# Patient Record
Sex: Female | Born: 1935 | Race: Black or African American | Hispanic: No | Marital: Single
Health system: Southern US, Community
[De-identification: ages and names within clinical notes are randomized; demographics above are authoritative.]

---

## 2006-03-09 ENCOUNTER — Ambulatory Visit: Payer: Self-pay | Admitting: Family Medicine

## 2006-03-10 ENCOUNTER — Ambulatory Visit: Payer: Self-pay | Admitting: Family Medicine

## 2007-03-23 ENCOUNTER — Ambulatory Visit: Payer: Self-pay | Admitting: Family Medicine

## 2008-10-10 ENCOUNTER — Ambulatory Visit: Payer: Self-pay | Admitting: Family Medicine

## 2009-12-12 ENCOUNTER — Ambulatory Visit (HOSPITAL_COMMUNITY)
Admission: RE | Admit: 2009-12-12 | Discharge: 2009-12-12 | Payer: Self-pay | Source: Home / Self Care | Admitting: Ophthalmology

## 2010-04-15 LAB — GLUCOSE, CAPILLARY
Glucose-Capillary: 104 mg/dL — ABNORMAL HIGH (ref 70–99)
Glucose-Capillary: 107 mg/dL — ABNORMAL HIGH (ref 70–99)
Glucose-Capillary: 154 mg/dL — ABNORMAL HIGH (ref 70–99)

## 2010-04-15 LAB — COMPREHENSIVE METABOLIC PANEL
AST: 21 U/L (ref 0–37)
Albumin: 3.5 g/dL (ref 3.5–5.2)
Calcium: 8.9 mg/dL (ref 8.4–10.5)
Chloride: 91 mEq/L — ABNORMAL LOW (ref 96–112)
Creatinine, Ser: 2.13 mg/dL — ABNORMAL HIGH (ref 0.4–1.2)
GFR calc Af Amer: 28 mL/min — ABNORMAL LOW (ref >60)
Sodium: 139 mEq/L (ref 135–145)

## 2010-04-15 LAB — CBC
MCH: 28.6 pg (ref 26.0–34.0)
MCHC: 31.7 g/dL (ref 30.0–36.0)
Platelets: 225 10*3/uL (ref 150–400)
RBC: 3.43 MIL/uL — ABNORMAL LOW (ref 3.87–5.11)

## 2010-05-05 NOTE — Op Note (Signed)
NAMENOORA, LOCASCIO                  ACCOUNT NO.:  [id number]  MEDICAL RECORD NO.:  [id number]          PATIENT TYPE:  AMB  LOCATION:  SDS                          FACILITY:  [facility]  PHYSICIAN:  Chalmers Guest, M.D.     DATE OF BIRTH:  1936/01/28  DATE OF PROCEDURE:  12/12/2009 DATE OF DISCHARGE:  12/12/2009                              OPERATIVE REPORT   PREOPERATIVE DIAGNOSIS:  Visually significant cataract left eye and controlled primary open-angle glaucoma.  The patient requires hospital surgery because of history of congestive heart failure, sleep apnea, uses home oxygen.  The patient has multiple medical problems.  PROCEDURE:  Phacoemulsification intraocular lens implant.  COMPLICATIONS:  None.  ANESTHESIA:  Xylocaine 2% with epinephrine and 50/50 mixture of 0.75% Marcaine with ampule of Wydase.  The patient was taken to the operative suite where she was given a peribulbar block with aforementioned local anesthetic agent.  The patient was extremely anxious and she was told by both anesthesia and the surgeon that we would do the best that we could to try to prevent intubating the patient because of the aforementioned lung and heart conditions.  After the patient's face was prepped and draped in usual sterile fashion, surgeon sitting temporally, a Weck-Cel sponge was used to fixate the globe.  A 15-degree blade was used to enter through the inferior clear cornea into the eye and Viscoat was injected.  Following this using a 2.75-mm keratome blade in a stepwise fashion through temporal clear cornea.  The eye was entered again.  A bent 25-gauge needle was used to incise the anterior capsule.  A curvilinear continuous tear capsulorrhexis was formed.  BSS was used to hydrodissect and hydrodelineate the nucleus.  Following this, the phacoemulsification unit was then used to sculpt the nucleus and the patient started to move a lot on the table.  Instruments were withdrawn and  preservative free 2% Xylocaine was injected into the anterior chamber and the patient was given a increased amount of oxygen, and slightly more sedation, procedure proceeded.  The patient's nucleus was sculpted and then the nucleus was cracked in half and the nuclear fragments were removed with no problem.  Following this, the IA was then used to remove the epinucleus and cortical fibers from the eye.  The posterior capsule remained intact.  Subincisional cortex was removed with angle cannula. Following this, Provisc was injected in the eye and it was noted that the capsule did appear to be a bit wrinkled at this point.  The intraocular lens implant which was the Alcon AcrySof and 60WF 24 diopter lens was placed in the [facility] spring injector, noted to have no defects. It was unfolded into the capsular bag.  The lens was centered with a Kuglen hook.  Following this, the Miostat was injected and viscoelastic was removed from the eye.  A single 10-0 nylon suture was placed, there being no leakage.  Topical TobraDex was placed on the eye.  A patch and Fox shield were placed and the patient returned to recovery area in stable condition.     Chalmers Guest, M.D.  RW/MEDQ  D:  12/12/2009  T:  12/13/2009  Job:  [id number]  Electronically Signed by Chalmers Guest M.D. on 05/05/2010 01:35:09 PM

## 2010-10-23 ENCOUNTER — Emergency Department: Payer: Self-pay | Admitting: Internal Medicine

## 2013-07-04 ENCOUNTER — Inpatient Hospital Stay: Payer: Self-pay | Admitting: Specialist

## 2013-07-04 LAB — CBC
HCT: 30.2 % — AB (ref 35.0–47.0)
HGB: 10 g/dL — ABNORMAL LOW (ref 12.0–16.0)
MCH: 29.9 pg (ref 26.0–34.0)
MCHC: 33.1 g/dL (ref 32.0–36.0)
MCV: 90 fL (ref 80–100)
PLATELETS: 237 10*3/uL (ref 150–440)
RBC: 3.34 10*6/uL — ABNORMAL LOW (ref 3.80–5.20)
RDW: 13.7 % (ref 11.5–14.5)
WBC: 11.7 10*3/uL — ABNORMAL HIGH (ref 3.6–11.0)

## 2013-07-04 LAB — URINALYSIS, COMPLETE
Bacteria: NONE SEEN
Bilirubin,UR: NEGATIVE
GLUCOSE, UR: NEGATIVE mg/dL (ref 0–75)
Granular Cast: 2
Hyaline Cast: 8
Ketone: NEGATIVE
NITRITE: NEGATIVE
PH: 5 (ref 4.5–8.0)
Protein: NEGATIVE
Specific Gravity: 1.011 (ref 1.003–1.030)
Squamous Epithelial: 2

## 2013-07-04 LAB — COMPREHENSIVE METABOLIC PANEL
ANION GAP: 12 (ref 7–16)
Albumin: 3.1 g/dL — ABNORMAL LOW (ref 3.4–5.0)
Alkaline Phosphatase: 66 U/L
BUN: 200 mg/dL — ABNORMAL HIGH (ref 7–18)
Bilirubin,Total: 0.5 mg/dL (ref 0.2–1.0)
CHLORIDE: 83 mmol/L — AB (ref 98–107)
CO2: 30 mmol/L (ref 21–32)
Calcium, Total: 9.7 mg/dL (ref 8.5–10.1)
Creatinine: 5.23 mg/dL — ABNORMAL HIGH (ref 0.60–1.30)
EGFR (Non-African Amer.): 7 — ABNORMAL LOW
GFR CALC AF AMER: 9 — AB
GLUCOSE: 198 mg/dL — AB (ref 65–99)
OSMOLALITY: 324 (ref 275–301)
POTASSIUM: 4.1 mmol/L (ref 3.5–5.1)
SGOT(AST): 30 U/L (ref 15–37)
SGPT (ALT): 12 U/L (ref 12–78)
Sodium: 125 mmol/L — ABNORMAL LOW (ref 136–145)
Total Protein: 8.9 g/dL — ABNORMAL HIGH (ref 6.4–8.2)

## 2013-07-04 LAB — PROTIME-INR
INR: 1.1
PROTHROMBIN TIME: 14.4 s (ref 11.5–14.7)

## 2013-07-04 LAB — TROPONIN I
TROPONIN-I: 0.14 ng/mL — AB
Troponin-I: 0.14 ng/mL — ABNORMAL HIGH
Troponin-I: 0.14 ng/mL — ABNORMAL HIGH

## 2013-07-05 LAB — BASIC METABOLIC PANEL
ANION GAP: 12 (ref 7–16)
BUN: 172 mg/dL — ABNORMAL HIGH (ref 7–18)
Calcium, Total: 9.5 mg/dL (ref 8.5–10.1)
Chloride: 90 mmol/L — ABNORMAL LOW (ref 98–107)
Co2: 28 mmol/L (ref 21–32)
Creatinine: 4.06 mg/dL — ABNORMAL HIGH (ref 0.60–1.30)
EGFR (African American): 12 — ABNORMAL LOW
GFR CALC NON AF AMER: 10 — AB
Glucose: 144 mg/dL — ABNORMAL HIGH (ref 65–99)
Osmolality: 320 (ref 275–301)
Potassium: 3.5 mmol/L (ref 3.5–5.1)
Sodium: 130 mmol/L — ABNORMAL LOW (ref 136–145)

## 2013-07-05 LAB — CBC WITH DIFFERENTIAL/PLATELET
BASOS PCT: 1.4 %
Basophil #: 0.1 10*3/uL (ref 0.0–0.1)
EOS ABS: 0.2 10*3/uL (ref 0.0–0.7)
Eosinophil %: 2.1 %
HCT: 28 % — AB (ref 35.0–47.0)
HGB: 9.5 g/dL — ABNORMAL LOW (ref 12.0–16.0)
Lymphocyte #: 1.3 10*3/uL (ref 1.0–3.6)
Lymphocyte %: 15.2 %
MCH: 30.6 pg (ref 26.0–34.0)
MCHC: 33.7 g/dL (ref 32.0–36.0)
MCV: 91 fL (ref 80–100)
MONO ABS: 1.1 x10 3/mm — AB (ref 0.2–0.9)
Monocyte %: 12.3 %
NEUTROS ABS: 5.9 10*3/uL (ref 1.4–6.5)
Neutrophil %: 69 %
Platelet: 202 10*3/uL (ref 150–440)
RBC: 3.09 10*6/uL — ABNORMAL LOW (ref 3.80–5.20)
RDW: 13.8 % (ref 11.5–14.5)
WBC: 8.6 10*3/uL (ref 3.6–11.0)

## 2013-07-05 LAB — PROTEIN ELECTROPHORESIS(ARMC)

## 2013-07-06 LAB — RENAL FUNCTION PANEL
ANION GAP: 11 (ref 7–16)
Albumin: 2.7 g/dL — ABNORMAL LOW (ref 3.4–5.0)
BUN: 145 mg/dL — AB (ref 7–18)
CREATININE: 3.19 mg/dL — AB (ref 0.60–1.30)
Calcium, Total: 9.7 mg/dL (ref 8.5–10.1)
Chloride: 96 mmol/L — ABNORMAL LOW (ref 98–107)
Co2: 27 mmol/L (ref 21–32)
EGFR (African American): 16 — ABNORMAL LOW
GFR CALC NON AF AMER: 14 — AB
GLUCOSE: 176 mg/dL — AB (ref 65–99)
OSMOLALITY: 320 (ref 275–301)
POTASSIUM: 3.3 mmol/L — AB (ref 3.5–5.1)
Phosphorus: 4.8 mg/dL (ref 2.5–4.9)
SODIUM: 134 mmol/L — AB (ref 136–145)

## 2013-07-06 LAB — UR PROT ELECTROPHORESIS, URINE RANDOM

## 2013-12-16 ENCOUNTER — Ambulatory Visit: Payer: Self-pay

## 2013-12-16 LAB — CBC WITH DIFFERENTIAL/PLATELET
BASOS ABS: 0.1 10*3/uL (ref 0.0–0.1)
Basophil %: 1.3 %
EOS ABS: 0.1 10*3/uL (ref 0.0–0.7)
EOS PCT: 1.4 %
HCT: 22.9 % — AB (ref 35.0–47.0)
HGB: 7.3 g/dL — AB (ref 12.0–16.0)
Lymphocyte #: 1.6 10*3/uL (ref 1.0–3.6)
Lymphocyte %: 18.3 %
MCH: 25.9 pg — ABNORMAL LOW (ref 26.0–34.0)
MCHC: 31.7 g/dL — ABNORMAL LOW (ref 32.0–36.0)
MCV: 82 fL (ref 80–100)
Monocyte #: 1.1 x10 3/mm — ABNORMAL HIGH (ref 0.2–0.9)
Monocyte %: 12.4 %
NEUTROS ABS: 5.8 10*3/uL (ref 1.4–6.5)
Neutrophil %: 66.6 %
Platelet: 234 10*3/uL (ref 150–440)
RBC: 2.81 10*6/uL — ABNORMAL LOW (ref 3.80–5.20)
RDW: 16.3 % — AB (ref 11.5–14.5)
WBC: 8.7 10*3/uL (ref 3.6–11.0)

## 2013-12-16 LAB — COMPREHENSIVE METABOLIC PANEL
ALBUMIN: 2.1 g/dL — AB (ref 3.4–5.0)
ALT: 6 U/L — AB
AST: 14 U/L — AB (ref 15–37)
Alkaline Phosphatase: 52 U/L
Anion Gap: 10 (ref 7–16)
BILIRUBIN TOTAL: 0.2 mg/dL (ref 0.2–1.0)
BUN: 112 mg/dL — ABNORMAL HIGH (ref 7–18)
CALCIUM: 6.8 mg/dL — AB (ref 8.5–10.1)
CO2: 31 mmol/L (ref 21–32)
CREATININE: 2.76 mg/dL — AB (ref 0.60–1.30)
Chloride: 96 mmol/L — ABNORMAL LOW (ref 98–107)
EGFR (African American): 22 — ABNORMAL LOW
EGFR (Non-African Amer.): 18 — ABNORMAL LOW
GLUCOSE: 128 mg/dL — AB (ref 65–99)
OSMOLALITY: 311 (ref 275–301)
POTASSIUM: 3.3 mmol/L — AB (ref 3.5–5.1)
Sodium: 137 mmol/L (ref 136–145)
TOTAL PROTEIN: 6.7 g/dL (ref 6.4–8.2)

## 2013-12-16 LAB — URINALYSIS, COMPLETE
Bilirubin,UR: NEGATIVE
Glucose,UR: NEGATIVE mg/dL (ref 0–75)
KETONE: NEGATIVE
NITRITE: NEGATIVE
PH: 5 (ref 4.5–8.0)
PROTEIN: NEGATIVE
RBC,UR: 1 /HPF (ref 0–5)
SPECIFIC GRAVITY: 1.01 (ref 1.003–1.030)

## 2013-12-19 ENCOUNTER — Inpatient Hospital Stay: Payer: Self-pay | Admitting: Internal Medicine

## 2013-12-19 LAB — URINALYSIS, COMPLETE
BILIRUBIN, UR: NEGATIVE
Glucose,UR: NEGATIVE mg/dL (ref 0–75)
KETONE: NEGATIVE
Nitrite: NEGATIVE
PROTEIN: NEGATIVE
Ph: 5 (ref 4.5–8.0)
RBC,UR: 10 /HPF (ref 0–5)
Specific Gravity: 1.013 (ref 1.003–1.030)

## 2013-12-19 LAB — COMPREHENSIVE METABOLIC PANEL
ALK PHOS: 53 U/L
AST: 13 U/L — AB (ref 15–37)
Albumin: 2.4 g/dL — ABNORMAL LOW (ref 3.4–5.0)
Anion Gap: 7 (ref 7–16)
BILIRUBIN TOTAL: 0.3 mg/dL (ref 0.2–1.0)
BUN: 133 mg/dL — AB (ref 7–18)
CALCIUM: 8.6 mg/dL (ref 8.5–10.1)
CHLORIDE: 92 mmol/L — AB (ref 98–107)
CO2: 34 mmol/L — AB (ref 21–32)
Creatinine: 3.13 mg/dL — ABNORMAL HIGH (ref 0.60–1.30)
EGFR (African American): 19 — ABNORMAL LOW
EGFR (Non-African Amer.): 15 — ABNORMAL LOW
GLUCOSE: 118 mg/dL — AB (ref 65–99)
OSMOLALITY: 310 (ref 275–301)
POTASSIUM: 3.8 mmol/L (ref 3.5–5.1)
SGPT (ALT): 8 U/L — ABNORMAL LOW
Sodium: 133 mmol/L — ABNORMAL LOW (ref 136–145)
TOTAL PROTEIN: 7.4 g/dL (ref 6.4–8.2)

## 2013-12-19 LAB — PRO B NATRIURETIC PEPTIDE: B-TYPE NATIURETIC PEPTID: 62845 pg/mL — AB (ref 0–450)

## 2013-12-19 LAB — URINE CULTURE

## 2013-12-19 LAB — IRON AND TIBC
IRON SATURATION: 5 %
Iron Bind.Cap.(Total): 266 ug/dL (ref 250–450)
Iron: 12 ug/dL — ABNORMAL LOW (ref 50–170)
UNBOUND IRON-BIND. CAP.: 254 ug/dL

## 2013-12-19 LAB — TSH: Thyroid Stimulating Horm: 3.94 u[IU]/mL

## 2013-12-19 LAB — CBC
HCT: 22.6 % — ABNORMAL LOW
HGB: 7.1 g/dL — ABNORMAL LOW
MCH: 25.7 pg — ABNORMAL LOW
MCHC: 31.4 g/dL — ABNORMAL LOW
MCV: 82 fL
Platelet: 215 10*3/uL
RBC: 2.76 X10 6/mm 3 — ABNORMAL LOW
RDW: 16.7 % — ABNORMAL HIGH
WBC: 8.1 10*3/uL

## 2013-12-19 LAB — FERRITIN: Ferritin (ARMC): 109 ng/mL

## 2013-12-19 LAB — PROTIME-INR
INR: 1.9
Prothrombin Time: 21.4 s — ABNORMAL HIGH

## 2013-12-19 LAB — TROPONIN I: Troponin-I: 0.11 ng/mL — ABNORMAL HIGH

## 2013-12-20 LAB — BASIC METABOLIC PANEL
ANION GAP: 8 (ref 7–16)
BUN: 135 mg/dL — AB (ref 7–18)
CALCIUM: 9.1 mg/dL (ref 8.5–10.1)
CHLORIDE: 91 mmol/L — AB (ref 98–107)
CREATININE: 2.98 mg/dL — AB (ref 0.60–1.30)
Co2: 34 mmol/L — ABNORMAL HIGH (ref 21–32)
Glucose: 101 mg/dL — ABNORMAL HIGH (ref 65–99)
Osmolality: 310 (ref 275–301)
Potassium: 3.5 mmol/L (ref 3.5–5.1)
Sodium: 133 mmol/L — ABNORMAL LOW (ref 136–145)

## 2013-12-20 LAB — CBC WITH DIFFERENTIAL/PLATELET
BASOS ABS: 0.1 10*3/uL (ref 0.0–0.1)
BASOS PCT: 0.7 %
EOS ABS: 0.2 10*3/uL (ref 0.0–0.7)
EOS PCT: 2.8 %
HCT: 22.1 % — ABNORMAL LOW (ref 35.0–47.0)
HGB: 6.9 g/dL — ABNORMAL LOW (ref 12.0–16.0)
LYMPHS ABS: 1.2 10*3/uL (ref 1.0–3.6)
LYMPHS PCT: 15.5 %
MCH: 25.8 pg — AB (ref 26.0–34.0)
MCHC: 31.3 g/dL — AB (ref 32.0–36.0)
MCV: 82 fL (ref 80–100)
MONO ABS: 1 x10 3/mm — AB (ref 0.2–0.9)
Monocyte %: 13.6 %
NEUTROS ABS: 5.1 10*3/uL (ref 1.4–6.5)
Neutrophil %: 67.4 %
Platelet: 209 10*3/uL (ref 150–440)
RBC: 2.69 10*6/uL — ABNORMAL LOW (ref 3.80–5.20)
RDW: 16.3 % — ABNORMAL HIGH (ref 11.5–14.5)
WBC: 7.6 10*3/uL (ref 3.6–11.0)

## 2013-12-20 LAB — PROTIME-INR
INR: 2
PROTHROMBIN TIME: 22.5 s — AB (ref 11.5–14.7)

## 2013-12-21 LAB — CBC WITH DIFFERENTIAL/PLATELET
Basophil #: 0.1 10*3/uL (ref 0.0–0.1)
Basophil %: 0.7 %
EOS PCT: 4.4 %
Eosinophil #: 0.3 10*3/uL (ref 0.0–0.7)
HCT: 24.8 % — AB (ref 35.0–47.0)
HGB: 7.9 g/dL — ABNORMAL LOW (ref 12.0–16.0)
LYMPHS ABS: 1.3 10*3/uL (ref 1.0–3.6)
Lymphocyte %: 17 %
MCH: 26.2 pg (ref 26.0–34.0)
MCHC: 31.7 g/dL — ABNORMAL LOW (ref 32.0–36.0)
MCV: 83 fL (ref 80–100)
Monocyte #: 1.1 x10 3/mm — ABNORMAL HIGH (ref 0.2–0.9)
Monocyte %: 13.9 %
NEUTROS PCT: 64 %
Neutrophil #: 4.8 10*3/uL (ref 1.4–6.5)
Platelet: 206 10*3/uL (ref 150–440)
RBC: 3 10*6/uL — ABNORMAL LOW (ref 3.80–5.20)
RDW: 16.2 % — AB (ref 11.5–14.5)
WBC: 7.5 10*3/uL (ref 3.6–11.0)

## 2013-12-21 LAB — URINE CULTURE

## 2013-12-21 LAB — PROTIME-INR
INR: 2.5
Prothrombin Time: 26.6 secs — ABNORMAL HIGH (ref 11.5–14.7)

## 2013-12-22 LAB — CBC WITH DIFFERENTIAL/PLATELET
BASOS ABS: 0 10*3/uL (ref 0.0–0.1)
Basophil %: 0.5 %
Eosinophil #: 0.2 10*3/uL (ref 0.0–0.7)
Eosinophil %: 3.7 %
HCT: 25.5 % — ABNORMAL LOW (ref 35.0–47.0)
HGB: 8.1 g/dL — AB (ref 12.0–16.0)
LYMPHS PCT: 21.2 %
Lymphocyte #: 1.4 10*3/uL (ref 1.0–3.6)
MCH: 26.1 pg (ref 26.0–34.0)
MCHC: 31.6 g/dL — ABNORMAL LOW (ref 32.0–36.0)
MCV: 83 fL (ref 80–100)
Monocyte #: 0.9 x10 3/mm (ref 0.2–0.9)
Monocyte %: 13.8 %
NEUTROS ABS: 4 10*3/uL (ref 1.4–6.5)
NEUTROS PCT: 60.8 %
Platelet: 204 10*3/uL (ref 150–440)
RBC: 3.09 10*6/uL — AB (ref 3.80–5.20)
RDW: 16.3 % — ABNORMAL HIGH (ref 11.5–14.5)
WBC: 6.6 10*3/uL (ref 3.6–11.0)

## 2013-12-22 LAB — BASIC METABOLIC PANEL
ANION GAP: 8 (ref 7–16)
BUN: 135 mg/dL — ABNORMAL HIGH (ref 7–18)
CALCIUM: 8.4 mg/dL — AB (ref 8.5–10.1)
CHLORIDE: 91 mmol/L — AB (ref 98–107)
CO2: 36 mmol/L — AB (ref 21–32)
Creatinine: 2.91 mg/dL — ABNORMAL HIGH (ref 0.60–1.30)
GFR CALC AF AMER: 20 — AB
GFR CALC NON AF AMER: 17 — AB
Glucose: 114 mg/dL — ABNORMAL HIGH (ref 65–99)
OSMOLALITY: 315 (ref 275–301)
POTASSIUM: 3.6 mmol/L (ref 3.5–5.1)
Sodium: 135 mmol/L — ABNORMAL LOW (ref 136–145)

## 2013-12-22 LAB — PROTIME-INR
INR: 2.4
PROTHROMBIN TIME: 25.9 s — AB (ref 11.5–14.7)

## 2013-12-23 LAB — CULTURE, BLOOD (SINGLE)

## 2013-12-24 LAB — CULTURE, BLOOD (SINGLE)

## 2014-01-01 ENCOUNTER — Inpatient Hospital Stay: Payer: Self-pay | Admitting: Internal Medicine

## 2014-01-01 LAB — URINALYSIS, COMPLETE
Bacteria: NONE SEEN
Bilirubin,UR: NEGATIVE
Blood: NEGATIVE
Glucose,UR: NEGATIVE mg/dL (ref 0–75)
KETONE: NEGATIVE
Nitrite: NEGATIVE
Ph: 5 (ref 4.5–8.0)
Protein: NEGATIVE
RBC,UR: 1 /HPF (ref 0–5)
Specific Gravity: 1.015 (ref 1.003–1.030)
Squamous Epithelial: 9

## 2014-01-01 LAB — COMPREHENSIVE METABOLIC PANEL
ALBUMIN: 2.3 g/dL — AB (ref 3.4–5.0)
ANION GAP: 10 (ref 7–16)
AST: 20 U/L (ref 15–37)
Alkaline Phosphatase: 50 U/L
BILIRUBIN TOTAL: 0.4 mg/dL (ref 0.2–1.0)
BUN: 131 mg/dL — AB (ref 7–18)
CHLORIDE: 99 mmol/L (ref 98–107)
Calcium, Total: 8.6 mg/dL (ref 8.5–10.1)
Co2: 34 mmol/L — ABNORMAL HIGH (ref 21–32)
Creatinine: 3.29 mg/dL — ABNORMAL HIGH (ref 0.60–1.30)
EGFR (African American): 18 — ABNORMAL LOW
GFR CALC NON AF AMER: 14 — AB
Glucose: 62 mg/dL — ABNORMAL LOW (ref 65–99)
OSMOLALITY: 325 (ref 275–301)
Potassium: 4.6 mmol/L (ref 3.5–5.1)
SGPT (ALT): <6 U/L — ABNORMAL LOW
SODIUM: 143 mmol/L (ref 136–145)
Total Protein: 7.7 g/dL (ref 6.4–8.2)

## 2014-01-01 LAB — CBC
HCT: 27 % — ABNORMAL LOW (ref 35.0–47.0)
HGB: 8.3 g/dL — ABNORMAL LOW (ref 12.0–16.0)
MCH: 25.9 pg — ABNORMAL LOW (ref 26.0–34.0)
MCHC: 30.6 g/dL — ABNORMAL LOW (ref 32.0–36.0)
MCV: 85 fL (ref 80–100)
Platelet: 191 10*3/uL (ref 150–440)
RBC: 3.19 10*6/uL — ABNORMAL LOW (ref 3.80–5.20)
RDW: 17.8 % — ABNORMAL HIGH (ref 11.5–14.5)
WBC: 10.1 10*3/uL (ref 3.6–11.0)

## 2014-01-01 LAB — PROTIME-INR
INR: 1.6
PROTHROMBIN TIME: 18.4 s — AB (ref 11.5–14.7)

## 2014-01-01 LAB — PHOSPHORUS: PHOSPHORUS: 6.9 mg/dL — AB (ref 2.5–4.9)

## 2014-01-01 LAB — AMMONIA: Ammonia, Plasma: 30 mcmol/L (ref 11–32)

## 2014-01-01 LAB — TROPONIN I: TROPONIN-I: 0.17 ng/mL — AB

## 2014-01-02 ENCOUNTER — Ambulatory Visit: Payer: Self-pay | Admitting: Internal Medicine

## 2014-01-02 LAB — CBC WITH DIFFERENTIAL/PLATELET
BASOS PCT: 1 %
Basophil #: 0.1 10*3/uL (ref 0.0–0.1)
Eosinophil #: 0 10*3/uL (ref 0.0–0.7)
Eosinophil %: 0.4 %
HCT: 26.9 % — AB (ref 35.0–47.0)
HGB: 8.2 g/dL — ABNORMAL LOW (ref 12.0–16.0)
LYMPHS ABS: 1.6 10*3/uL (ref 1.0–3.6)
Lymphocyte %: 16.3 %
MCH: 26 pg (ref 26.0–34.0)
MCHC: 30.6 g/dL — AB (ref 32.0–36.0)
MCV: 85 fL (ref 80–100)
MONO ABS: 1.3 x10 3/mm — AB (ref 0.2–0.9)
Monocyte %: 12.9 %
NEUTROS ABS: 7 10*3/uL — AB (ref 1.4–6.5)
Neutrophil %: 69.4 %
PLATELETS: 195 10*3/uL (ref 150–440)
RBC: 3.16 10*6/uL — ABNORMAL LOW (ref 3.80–5.20)
RDW: 18.1 % — AB (ref 11.5–14.5)
WBC: 10.1 10*3/uL (ref 3.6–11.0)

## 2014-01-02 LAB — BASIC METABOLIC PANEL
Anion Gap: 9 (ref 7–16)
BUN: 124 mg/dL — ABNORMAL HIGH (ref 7–18)
CREATININE: 3.21 mg/dL — AB (ref 0.60–1.30)
Calcium, Total: 8.8 mg/dL (ref 8.5–10.1)
Chloride: 100 mmol/L (ref 98–107)
Co2: 35 mmol/L — ABNORMAL HIGH (ref 21–32)
EGFR (African American): 18 — ABNORMAL LOW
EGFR (Non-African Amer.): 15 — ABNORMAL LOW
GLUCOSE: 72 mg/dL (ref 65–99)
OSMOLALITY: 325 (ref 275–301)
Potassium: 4.6 mmol/L (ref 3.5–5.1)
SODIUM: 144 mmol/L (ref 136–145)

## 2014-01-02 LAB — URINE CULTURE

## 2014-01-03 ENCOUNTER — Inpatient Hospital Stay: Payer: Self-pay | Admitting: Internal Medicine

## 2014-01-03 LAB — UR PROT ELECTROPHORESIS, URINE RANDOM

## 2014-01-03 LAB — PROTEIN ELECTROPHORESIS(ARMC)

## 2014-02-02 ENCOUNTER — Ambulatory Visit: Payer: Self-pay | Admitting: Internal Medicine

## 2014-02-02 DEATH — deceased

## 2014-05-26 NOTE — Consult Note (Signed)
Chief Complaint:  Subjective/Chief Complaint No bleeding. In fact, no BM. Stomach still bothers her. According to daughter, patient had bowel obstruction earlier this year and required surgery.   VITAL SIGNS/ANCILLARY NOTES: **Vital Signs.:   20-Nov-15 09:20  Vital Signs Type Q 4hr  Temperature Temperature (F) 98.6  Celsius 37  Temperature Source oral  Pulse Pulse 91  Respirations Respirations 18  Systolic BP Systolic BP 500  Diastolic BP (mmHg) Diastolic BP (mmHg) 68  Mean BP 81  Pulse Ox % Pulse Ox % 96  Pulse Ox Activity Level  At rest  Oxygen Delivery 2L   Brief Assessment:  GEN no acute distress   Cardiac Regular   Respiratory clear BS   Gastrointestinal soft, nontender, NABS   Lab Results: Routine Chem:  20-Nov-15 04:24   Glucose, Serum  114  BUN  135  Creatinine (comp)  2.91  Sodium, Serum  135  Potassium, Serum 3.6  Chloride, Serum  91  CO2, Serum  36  Calcium (Total), Serum  8.4  Anion Gap 8  Osmolality (calc) 315  eGFR (African American)  20  eGFR (Non-African American)  17 (eGFR values <26m/min/1.73 m2 may be an indication of chronic kidney disease (CKD). Calculated eGFR, using the MRDR Study equation, is useful in  patients with stable renal function. The eGFR calculation will not be reliable in acutely ill patients when serum creatinine is changing rapidly. It is not useful in patients on dialysis. The eGFR calculation may not be applicable to patients at the low and high extremes of body sizes, pregnant women, and vegetarians.)  Routine Coag:  20-Nov-15 04:24   Prothrombin  25.9  INR 2.4 (INR reference interval applies to patients on anticoagulant therapy. A single INR therapeutic range for coumarins is not optimal for all indications; however, the suggested range for most indications is 2.0 - 3.0. Exceptions to the INR Reference Range may include: Prosthetic heart valves, acute myocardial infarction, prevention of myocardial infarction,  and combinations of aspirin and anticoagulant. The need for a higher or lower target INR must be assessed individually. Reference: The Pharmacology and Management of the Vitamin K  antagonists: the seventh ACCP Conference on Antithrombotic and Thrombolytic Therapy. CBBCWU.8891Sept:126 (3suppl): [id number] A HCT value >55% may artifactually increase the PT.  In one study,  the increase was an average of 25%. Reference:  "Effect on Routine and Special Coagulation Testing Values of Citrate Anticoagulant Adjustment in Patients with High HCT Values." American Journal of Clinical Pathology 2006;126:400-405.)  Routine Hem:  20-Nov-15 04:24   WBC (CBC) 6.6  RBC (CBC)  3.09  Hemoglobin (CBC)  8.1  Hematocrit (CBC)  25.5  Platelet Count (CBC) 204  MCV 83  MCH 26.1  MCHC  31.6  RDW  16.3  Neutrophil % 60.8  Lymphocyte % 21.2  Monocyte % 13.8  Eosinophil % 3.7  Basophil % 0.5  Neutrophil # 4.0  Lymphocyte # 1.4  Monocyte # 0.9  Eosinophil # 0.2  Basophil # 0.0 (Result(s) reported on 22 Dec 2013 at 05:32AM.)   Assessment/Plan:  Assessment/Plan:  Assessment Anemia. Stable. No signs of bleeeding.   Plan Get records from [facility] Continue PPI. If stomach still bothers her, order 2 way abdominal series. Call GI on call if there are issues over the weekend. Thanks.   Electronic Signatures: OVerdie Shire(MD)  (Signed [phone])  Authored: Chief Complaint, VITAL SIGNS/ANCILLARY NOTES, Brief Assessment, Lab Results, Assessment/Plan   Last Updated: 20-Nov-15 10:18 by OVerdie Shire(MD)

## 2014-05-26 NOTE — H&P (Signed)
PATIENT NAME:  Evelyn [name], Evelyn [name] MR#:  [id number] DATE OF BIRTH:  [birth year]  DATE OF ADMISSION:  07/04/2013  PRIMARY CARE PHYSICIAN: Located at [facility].   CHIEF COMPLAINT: Weakness, difficulty ambulating and myoclonic jerky movements.   HISTORY OF PRESENT ILLNESS: This is a 79 year old female brought into the Emergency Room by her son due to increasing jerky movements and having difficulty ambulating. The patient normally is able to get around with the help of a walker, but today, she was not even able to get out of bed. The son also noticed that she has been having more and more increasingly jerky movements. As per the son, this has happened to her before when she was really dehydrated. The patient is on high-dose diuretics, but has not been eating and drinking well for the past few days. She was brought to the Emergency Room and was noted to be in acute on chronic renal failure with a creatinine as high as 5, with a BUN of 200. Hospitalist services were contacted for further treatment and evaluation. The patient denies any chest pain, any shortness of breath. She denies any nausea, vomiting, abdominal pain, fevers, chills, cough or any other associated symptoms presently.   REVIEW OF SYSTEMS:  CONSTITUTIONAL: No documented fever. Positive fatigue and weakness. No weight gain, no weight loss.  EYES: No blurry or double vision.  ENT: No tinnitus. No postnasal drip. No redness of the oropharynx.  RESPIRATORY: No cough. No wheeze. No hemoptysis. No dyspnea.  CARDIOVASCULAR: No chest pain. No orthopnea. No palpitations. No syncope.  GASTROINTESTINAL: No nausea, no vomiting, no diarrhea, no abdominal pain. No melena or hematochezia.  GENITOURINARY: No dysuria, no hematuria.  ENDOCRINE: No polyuria or nocturia. No heat or cold intolerance.  HEMATOLOGIC: No anemia, no bruising, no bleeding.  INTEGUMENTARY: No rashes. No lesions.  MUSCULOSKELETAL: No arthritis. No swelling. No gout.  NEUROLOGIC: No numbness or  tingling. No ataxia. No seizure-type activity.  PSYCHIATRIC: No anxiety. No insomnia. No ADD.   PAST MEDICAL HISTORY: Consistent with:  1. History of pulmonary hypertension.  2. Diabetes.  3. Chronic kidney disease stage IV.  4. Gout.  5. Glaucoma.  6. History of CHF. 7. Chronic pain.  8. COPD. 9. History of hyponatremia.  10. Peripheral vascular disease.   ALLERGIES: CODEINE AND REGLAN.   SOCIAL HISTORY: Used to be a smoker, quit about 30+ years ago. Does have a 30-pack-year smoking history. No alcohol abuse. No illicit drug abuse. Lives at home with her son.   FAMILY HISTORY: The patient's mother and father are both deceased. Mother died from complications of heart disease. She cannot recall what her father died from.   CURRENT MEDICATIONS: As follows:  1. Tylenol with oxycodone 10/325 two tabs 4 times daily as needed. 2. DuoNebs q.6 hours as needed. 3. Ammonium lactate topical cream applied daily.  4. Aspirin 81 mg daily.  5. Atorvastatin 40 mg at bedtime. 6. Azelastine 1 spray b.i.d. 7. Bimatoprost ophthalmic solution 0.03% 1 drop to left eye at bedtime.  8. Brimonidine ophthalmic solution 0.1% 1 drop to each eye b.i.d. 9. Cetirizine 10 mg daily. 10. Plavix 75 mg daily. 11. Flonase 2 sprays to each nostril daily. 12. Advair 250/50 one puff b.i.d. 13. Lasix 40 mg 4 tablets b.i.d. 14. Insulin 70/30 at 4 units b.i.d. 15. Latanoprost 0.005% ophthalmic solution at bedtime. 16. Metolazone 10 mg tablet 1 tab b.i.d. 17. Miconazole topical powder b.i.d. as needed.  18. Multivitamin daily. 19. Sublingual nitroglycerin as needed. 20. MiraLax  daily as needed. 21. Lyrica 25 mg b.i.d. 22. Promethazine 25 mg q.6 hours as needed.  23. Sildenafil 20 mg t.i.d. 24. Silver sulfadiazine topical cream to be applied to the affected area as needed.  25. Spiriva 1 puff daily. 26. Aldactone 100 mg daily.  27. Triamcinolone cream to be applied t.i.d.  28. Vibra-Tabs 100 mg 2 tabs for 10  days.   PHYSICAL EXAMINATION: Presently is as follows:  VITAL SIGNS: Noted to be: Temperature 98.5, pulse 76, respirations 20, blood pressure 107/59, saturation is 97% on room air.  GENERAL: She is a lethargic-appearing female but in no apparent distress. HEAD, EYES, EARS, NOSE AND THROAT: She is atraumatic, normocephalic. Extraocular muscles are intact. Pupils equally reactive on to light. Sclerae anicteric. No conjunctival injection. No pharyngeal erythema.  NECK: Supple. There is no jugular venous distention. No bruits. No lymphadenopathy or thyromegaly.  HEART: Regular rate and rhythm, tachycardic. No murmurs, no rubs, no clicks.  LUNGS: Clear to auscultation bilaterally. No rales, no rhonchi, no wheezes.  ABDOMEN: Soft, distended, tender, but no rebound or rigidity. Good bowel sounds. No hepatosplenomegaly appreciated.  EXTREMITIES: No evidence of any cyanosis, clubbing or peripheral edema. Has +2 pedal and radial pulses bilaterally.  NEUROLOGICAL: The patient is alert, awake and oriented x3 with no focal motor or sensory deficits appreciated bilaterally.  SKIN: Moist and warm. The patient does have a left lower extremity peripheral vascular disease ulcer which is wrapped and also a pressure sore on her left great toe, which also has a dressing on it.  LYMPHATIC: There is no cervical or axillary lymphadenopathy.   LABORATORY DATA: Showed a serum glucose of 198, BUN 200, creatinine 5.2, sodium 125, potassium 41, chloride 83, bicarbonate 30. The patient's LFTs are within normal limits. Troponin 0.14. White cell count 11.7, hemoglobin 10, hematocrit 30.2, platelet count 237. INR is 1.1. Urinalysis within normal limits.   The patient did have a CT of the head done without contrast. It shows no acute abnormality. Diffuse cerebral and cerebellar atrophy.   ASSESSMENT AND PLAN: This is a 79 year old female with history of pulmonary hypertension, diabetes, chronic kidney disease stage IV, gout,  glaucoma, history of congestive heart failure, chronic pain, chronic obstructive pulmonary disease, history of hyponatremia, who presents to the hospital due to feeling weak and having jerky myoclonic movements and noted to be in acute on chronic renal failure.   1. Acute on chronic renal failure. This is likely secondary to dehydration and poor p.o. intake and concomitant use of high-dose diuretics. The patient apparently is on 160 mg of Lasix twice daily along with metolazone and Aldactone. For now, I will hold all diuretics. I will aggressively hydrate her with IV fluids and follow BUN and creatinine. I will check a renal ultrasound, place a Foley and get a nephrology consult. Discussed the case with Dr. Thedore MinsSingh. There is no urgent need for hemodialysis presently.  2. Myoclonic jerky movements. This is likely secondary to the renal failure, also concomitant use of Lyrica. I will hold the Lyrica now, and the jerky movements should likely improve as her kidney function improves.  3. History of congestive heart failure. Unclear if this is systolic versus diastolic congestive heart failure. I do not have an echocardiogram to compare with. Clinically, the patient is not in congestive heart failure. I will hold all diuretics given her renal failure presently.  4. Glaucoma. Continue latanoprost and brimonidine eyedrops.  5. Diabetes. Placed on sliding scale insulin. Follow blood sugars.  6. Chronic  obstructive pulmonary disease. No acute exacerbation. Continue Advair and Spiriva. 7. Hyponatremia. This is likely hypovolemic hyponatremia due to poor p.o. intake and dehydration. I will aggressively hydrate her with IV fluids, follow sodium.  8. Elevated troponin. This is likely in the setting of chronic kidney disease stage IV. No evidence of acute coronary syndrome. The patient has no chest pain. I will cycle her cardiac markers, and if they trend upwards, I will consider getting a cardiology consult.   The plan  was discussed with the patient's family, and they are in agreement.   TIME SPENT: 50 minutes.   ____________________________ Rolly Pancake. Cherlynn Kaiser, MD vjs:lb D: 07/04/2013 13:47:57 ET T: 07/04/2013 14:02:16 ET JOB#: [id number]  cc: Rolly Pancake. Cherlynn Kaiser, MD, <Dictator> Houston Siren MD ELECTRONICALLY SIGNED 07/08/2013 21:54

## 2014-05-26 NOTE — Consult Note (Signed)
Chief Complaint:  Subjective/Chief Complaint Pt sleepy. Now c/o sick feeling in stomach. Now [facility] Please get records.   VITAL SIGNS/ANCILLARY NOTES: **Vital Signs.:   19-Nov-15 12:45  Vital Signs Type Q 4hr  Temperature Temperature (F) 97.5  Celsius 36.3  Pulse Pulse 76  Respirations Respirations 17  Systolic BP Systolic BP 145  Diastolic BP (mmHg) Diastolic BP (mmHg) 77  Mean BP 99  Pulse Ox % Pulse Ox % 98  Pulse Ox Activity Level  At rest  Oxygen Delivery 2L   Brief Assessment:  GEN no acute distress   Cardiac Regular   Respiratory clear BS   Gastrointestinal mild abdominal tenderness   Lab Results: Routine Chem:  18-Nov-15 04:38   Glucose, Serum  101  BUN  135  Creatinine (comp)  2.98  Sodium, Serum  133  Potassium, Serum 3.5  Chloride, Serum  91  CO2, Serum  34  Calcium (Total), Serum 9.1  Anion Gap 8 (Result(s) reported on 20 Dec 2013 at 05:24AM.)  Osmolality (calc) 310  Routine Coag:  18-Nov-15 04:38   Prothrombin  22.5  INR 2.0 (INR reference interval applies to patients on anticoagulant therapy. A single INR therapeutic range for coumarins is not optimal for all indications; however, the suggested range for most indications is 2.0 - 3.0. Exceptions to the INR Reference Range may include: Prosthetic heart valves, acute myocardial infarction, prevention of myocardial infarction, and combinations of aspirin and anticoagulant. The need for a higher or lower target INR must be assessed individually. Reference: The Pharmacology and Management of the Vitamin K  antagonists: the seventh ACCP Conference on Antithrombotic and Thrombolytic Therapy. Chest.2004 Sept:126 (3suppl): [id number]. A HCT value >55% may artifactually increase the PT.  In one study,  the increase was an average of 25%. Reference:  "Effect on Routine and Special Coagulation Testing Values of Citrate Anticoagulant Adjustment in Patients with High HCT  Values." American Journal of Clinical Pathology 2006;126:400-405.)  19-Nov-15 05:09   Prothrombin  26.6  INR 2.5 (INR reference interval applies to patients on anticoagulant therapy. A single INR therapeutic range for coumarins is not optimal for all indications; however, the suggested range for most indications is 2.0 - 3.0. Exceptions to the INR Reference Range may include: Prosthetic heart valves, acute myocardial infarction, prevention of myocardial infarction, and combinations of aspirin and anticoagulant. The need for a higher or lower target INR must be assessed individually. Reference: The Pharmacology and Management of the Vitamin K  antagonists: the seventh ACCP Conference on Antithrombotic and Thrombolytic Therapy. Chest.2004 Sept:126 (3suppl): [id number]. A HCT value >55% may artifactually increase the PT.  In one study,  the increase was an average of 25%. Reference:  "Effect on Routine and Special Coagulation Testing Values of Citrate Anticoagulant Adjustment in Patients with High HCT Values." American Journal of Clinical Pathology 2006;126:400-405.)  Routine Hem:  18-Nov-15 04:38   WBC (CBC) 7.6  RBC (CBC)  2.69  Hemoglobin (CBC)  6.9  Hematocrit (CBC)  22.1  Platelet Count (CBC) 209  MCV 82  MCH  25.8  MCHC  31.3  RDW  16.3  Neutrophil % 67.4  Lymphocyte % 15.5  Monocyte % 13.6  Eosinophil % 2.8  Basophil % 0.7  Neutrophil # 5.1  Lymphocyte # 1.2  Monocyte #  1.0  Eosinophil # 0.2  Basophil # 0.1 (Result(s) reported on 20 Dec 2013 at 05:24AM.)  19-Nov-15 05:09   WBC (CBC) 7.5  RBC (CBC)  3.00  Hemoglobin (CBC)  7.9  Hematocrit (CBC)  24.8  Platelet Count (CBC) 206  MCV 83  MCH 26.2  MCHC  31.7  RDW  16.2  Neutrophil % 64.0  Lymphocyte % 17.0  Monocyte % 13.9  Eosinophil % 4.4  Basophil % 0.7  Neutrophil # 4.8  Lymphocyte # 1.3  Monocyte #  1.1  Eosinophil # 0.3  Basophil # 0.1 (Result(s) reported on 21 Dec 2013 at 06:10AM.)    Assessment/Plan:  Assessment/Plan:  Assessment Anemia. Off coumadin/ASA now.   Plan Check stool for blood. Get colonscopy report. Continue daily protonix bid. If blood in stool and patient remains symptomatic, may consider EGD later when INR down further. Otherwise, continue to moniter. Thanks.   Electronic Signatures: Lutricia Feil (MD)  (Signed [phone] 14:01)  Authored: Chief Complaint, VITAL SIGNS/ANCILLARY NOTES, Brief Assessment, Lab Results, Assessment/Plan   Last Updated: 19-Nov-15 14:01 by Lutricia Feil (MD)

## 2014-05-26 NOTE — Consult Note (Signed)
Pt seen and examined. Full consult to follow. Poor historian. Pt with VRE infection.  Pt on coumadin and ASA. Iron level low but ferritin level ok. C/O occasional constipation and nausea. No abdominal pain or cramping. States having a normal colonoscopy in the past 1-2 years here. Cannot find records. C/O SOB. Abnormal Echo report noted. Agree with holding coumadin and ASA. Will see if there are any colonoscopy report anywhere before I make further recommendations. WIll follow. Thanks.   Electronic Signatures: Lutricia Feilh, Maddalena Linarez (MD) (Signed on [phone]-Nov-15 16:08)  Authored   Last Updated: 18-Nov-15 16:09 by Lutricia Feilh, Maiyah Goyne (MD)

## 2014-05-26 NOTE — Discharge Summary (Signed)
PATIENT NAME:  Evelyn [name], Evelyn [name] MR#:  [id number] DATE OF BIRTH:  [birth year]  DATE OF ADMISSION:  12/19/2013 DATE OF DISCHARGE:  12/22/2013  ADMISSION DIAGNOSIS: Vancomycin-resistant enterococcal urinary tract infection.   DISCHARGE DIAGNOSES: 1.  Metabolic encephalopathy from Vancomycin-resistant enterococcal and extended-spectrum beta-lactamase urinary tract infection, chronic catheter associated.  2.  Vancomycin-resistant enterococcal, extended-spectrum beta-lactamase urinary tract infection secondary to chronic Foley.  3.  Acute blood loss anemia, on chronic disease.  4.  History of atrial fibrillation disease. 5.  Stage III chronic kidney disease.  6.  Diabetes, insulin-dependent; possibly her kidney disease could be from diabetes.  7.  Essential hypertension.   CONSULTATIONS: Dr. Bluford Kaufmannh.  DIAGNOSTIC DATA: Pertinent laboratories: The patient's urine culture prior to admission was positive for VRE. She had ESBL urine culture here.   Blood cultures, 1 out of 2 was staph epi, which was felt to be a contaminant.   Discharge laboratories: White blood cells 6.6, hemoglobin 8, hematocrit 25, platelets 204,000. sodium 135, potassium 3.6, chloride 91, bicarb 36, BUN 135, creatinine 2.91, glucose 114, calcium 8.4.   DISCHARGE PHYSICAL EXAMINATION: VITAL SIGNS: Temperature 98.6, pulse 91, respirations 18, blood pressure 109/68, 96% on 2 liters.  GENERAL: The patient is alert and oriented x3, not in acute distress.  CARDIOVASCULAR: There is a 3/6 systolic ejection murmur heard best throughout the whole precordium without radiation, regular rate and rhythm.  LUNGS: Clear to auscultation without crackles, rales, rhonchi, or wheezing. Normal to percussion.  ABDOMEN: Bowel sounds positive. Nontender, nondistended. No hepatosplenomegaly. She has an abdominal scar, which is healing.  EXTREMITIES: No clubbing, cyanosis or edema. NEUROLOGIC: Cranial nerves II through XII are grossly intact.   HOSPITAL  COURSE: This is a [age] year old female who presented with confusion and found to have a urinary tract infection of VRE on urine culture 2 days prior to admission. For further details, please refer to the H and P.  1.  Metabolic encephalopathy. The patient was admitted with confusion. Metabolic encephalopathy secondary to her VRE urinary tract infection. It also turns out that she has ESBL urinary tract infection as well. It should be noted that the patient had a Foley placed prior to admission, which is the likely cause of her urinary tract infection. We have discontinued the Foley. She will need I and O every 6 hours. Her metabolic encephalopathy is much improved. She is back at her baseline.  2.  VRE and ESBL urinary tract infection without hematuria, but in a patient who had a Foley placed prior to admission. She has already a left PICC line placed. She was started on Invanz and linezolid. She will need a total of 14 days of treatment. She is on linezolid and Invanz as mentioned above. Her blood cultures, 1 out of 2, was staph epi which was a contaminant.  3.  Acute blood loss anemia, on chronic disease. I discussed options with the son as the patient is on Coumadin and aspirin for atrial fibrillation. GI was consulted. At this time, due to her comorbidities, it was not recommended for invasive work-up but rather close observation. Her hemoglobin remained stable here. She did require 1 unit of PRBCs during the hospitalization. We are holding Coumadin and aspirin. Son is aware of increased potential for stroke in this patient. She is on a PPI.  4.  History of atrial fibrillation, which is a recent diagnosis. We are stopping anticoagulation due to #3. The patient will need outpatient follow-up for her atrial fibrillation as well as her  GI blood loss anemia.  5.  Stage III/IV chronic kidney disease at baseline.  6.  Diabetes, insulin-dependent. Initially there is no mention of complications. However, it could be  that the chronic kidney disease is from diabetes and blood pressure. She will continue on her outpatient medications and ADA diet.  7.  Essential hypertension. The patient is on Aldactone. Her creatinine remains stable so she is to resume this. She will need close follow-up.   DISCHARGE DIET: Renal ADA diet.   DISCHARGE ACTIVITY: As tolerated.  DISCHARGE REFERRAL: Physical therapy.   DISCHARGE RECOMMENDATIONS:  1.  The patient needs I and O every 6 hours for urinary retention.  2.  Routine PICC line care.  3.  No NSAIDs, aspirin or Coumadin for now.   DISCHARGE FOLLOWUP: The patient will follow up with Dr. Thedore Mins from nephrology in 1 week. She will also follow up with Dr. Bluford Kaufmann in 1 week as well.  The patient is stable for discharge.   CODE STATUS: DNR status.  TIME SPENT ON DISCHARGE: Approximately 40 minutes. ____________________________ Janyth Contes. Juliene Pina, MD spm:sb D: 12/22/2013 09:46:23 ET T: 12/22/2013 10:08:54 ET JOB#: [id number]  cc: Ignatz Deis P. Juliene Pina, MD, <Dictator> Janyth Contes Mylena Sedberry MD ELECTRONICALLY SIGNED 12/22/2013 13:58

## 2014-05-26 NOTE — Consult Note (Signed)
PATIENT NAME:  Evelyn [name], Evelyn [name] MR#:  [id number] DATE OF BIRTH:  August 16, 1935  DATE OF CONSULTATION:  12/20/2013  REFERRING PHYSICIAN:   CONSULTING PHYSICIAN:  Ezzard StandingPaul Y. Bluford Kaufmannh, MD  REASON FOR REFERRAL: Anemia.   DISCUSSION: The patient is a 79 year old female who was brought in due to mental status changes. She had a urine culture that was growing VRE. Because she was a poor historian it was really difficult to get much history. She did admit to having some chills. When I talked to her, she denied having any rectal bleeding. She does have some intermittent constipation as well as nausea. She states that she had a normal colon[place] in the past [place] 2 [place]. Unfortunately, I cannot find any records of this. She does complain of shortness of breath. She had an echocardiogram done that was definitely abnormal. Her initial hemoglobin was only 3.6 with worsening creatinine.   PAST MEDICAL HISTORY: Notable for coronary artery disease, chronic kidney disease, diabetes, hypertension. She also has COPD, history of reflux, peripheral vascular disease, and chronic pain. Her initial pulsometry was only in the 60s.   PAST SURGICAL HISTORY: Negative.   ALLERGIES: SHE IS ALLERGIC TO CODEINE.   SOCIAL HISTORY: She quits tobacco years ago. No alcohol.   FAMILY HISTORY: Heart disease.   MEDICATIONS AT HOME: Include Percocet, albuterol inhaler, baby aspirin, atorvastatin 40 mg at bedtime, Coumadin 4.5 mg daily, Benadryl, Flonase spray, Lasix 20 mg every 8 hours on Monday, Wednesday, and Friday. She also takes insulin twice a day. She has some eye drops, metolazone twice a day, Protonix 40 mg twice a day, MiraLax, Procrit, senna, and other medications.   REVIEW OF SYSTEMS: Cannot be obtained.   PHYSICAL EXAMINATION:  GENERAL: The patient appears to be slightly short of breath when I saw her. Otherwise, she was afebrile and vital signs were otherwise stable.  HEAD AND NECK: Within normal limits.  CARDIAC:  Revealed regular rhythm and rate.  LUNGS: Showed decreased breath sounds bilaterally.  ABDOMEN: Soft and nontender. There is no hepatomegaly. She had active bowel sounds.  EXTREMITIES: Showed some +2 edema bilaterally.   LABORATORY AND RADIOLOGICAL DATA: Chest x-ray shows decreased lung volumes possibly due to atelectasis. CT of the head was negative. Labs showed iron level of only 12, but ferritin level was normal at 105. Creatinine was 3.13, sodium 133. Liver enzymes were normal. TSH was normal. Hemoglobin was 7.1, MCV normal at 82, white count was 8.1. INR was 1.9. Urine culture was positive.   ASSESSMENT AND PLAN: This is a patient with probably chronic anemia with a low iron level. I need to find out whether there is actually a recent colonoscopy report for me to review before I make further recommendation. I do recommend holding the Coumadin and aspirin while she is anemic. She is not in any state to undergo any procedures at this point until her breathing status improves. I will continue to follow the patient.   Thank you for the referral.     ____________________________ Ezzard StandingPaul Y. Bluford Kaufmannh, MD pyo:ts D: 12/21/2013 09:53:23 ET T: 12/21/2013 10:07:38 ET JOB#: [id number]  cc: Ezzard StandingPaul Y. Bluford Kaufmannh, MD, <Dictator> Ezzard StandingPAUL Y Amaia Lavallie MD ELECTRONICALLY SIGNED 12/21/2013 14:51

## 2014-05-26 NOTE — H&P (Signed)
PATIENT NAME:  Evelyn [name], Evelyn [name] MR#:  [id number] DATE OF BIRTH:  06/14/1935  DATE OF ADMISSION:  12/19/2013  PRIMARY CARE PHYSICIAN:  Drue Flirtarol Henry-Smith, MD, over at [facility].   HISTORY OF PRESENT ILLNESS:  Patient was sent in with altered mental status and recently had a urine culture that is growing out VRE. As per the ER physician, the patient gets self-catheterizations at the facility. The patient is a poor historian, altered mental status and unable to give me much history. She is able to move all of her extremities at home. She does say she has a cold chill and does have some shaking of the left arm. The patient was found to be anemic with a hemoglobin of 7.1, and worsening creatinine; with creatinine of 3.13, and GFR of 19; borderline troponin. Hospitalist services were contacted for further evaluation; in the ER, pulse oximetry difficult to read but in the 60s, at some point.   PAST MEDICAL HISTORY: Coronary artery disease, chronic kidney disease, diabetes, hypertension, COPD, hyperlipidemia, gastroesophageal reflux disease, peripheral vascular disease, hyponatremia, chronic pain.   PAST SURGICAL HISTORY: Unable to obtain from patient at this time.   ALLERGIES: IN THE CHART IS CODEINE.   SOCIAL HISTORY: Currently at [facility], quit smoking 30 years ago; no alcohol, no drug use.   FAMILY HISTORY: From old chart, both parents are deceased, complications of the heart disease.   MEDICATIONS: As per prescription writer includes Percocet 10/325 two tablets every 4 hours as needed for pain; albuterol ipratropium 1 puff every 6 hours as needed for shortness of breath; ammonium lactate 12% topical cream twice a day; aspirin 81 mg daily, atorvastatin 40 mg at bedtime; Astelin nasal spray 137 mcg 1 spray twice a day; brimonidine ophthalmic 0.2% ophthalmic solution 1 drop both eyes twice a day; Zyrtec 10 mg daily, Coumadin 4.5 mg daily, diphenhydramine 12.5 mg chewable daily; Flonase 50  mcg per spray once a day; furosemide 120 mg IV every 8 hours on Monday, Wednesday and Friday; 70/30 insulin 4 units subcutaneous injection twice a day, ketoconazole 2% apply to skin breakdown on buttocks with  zinc oxide once a day; lactulose 30 mL twice a day, latanoprost 0.005% ophthalmic solution 1 drop both eyes at bedtime; lidocaine topical 5%, as needed; Lyrica 50 mg twice a day, magnesium citrate p.r.n., metolazone 10 mg twice a day; milk of magnesia 8% of 30 mL once a day as needed for constipation; multivitamin 1 tablet once a day, nitroglycerin 0.4 mg every 5 minutes as needed for chest pain, Protonix 40 mg twice a day; MiraLAX 17 grams twice a day as needed for constipation; potassium chloride 20 mEq twice a day, Monday, Wednesday, Friday; Procrit 10,000 units injectable on Wednesday, saline nasal spray 1 spray as needed for nasal congestion, senna plus 2 tablets twice a day; sildenafil 20 mg 2 tablets every 8 hours; simethicone 80 mg 2 tablets every 6 hours, Spiriva 18 mcg 1 inhalation daily; TUMS 500 mg 3 times a day as needed; zinc oxide as needed.   REVIEW OF SYSTEMS: Unable to obtain.   PHYSICAL EXAMINATION: VITAL SIGNS: Pulse 93, respirations 20, blood pressure 123/73, pulse oximetry 64% on room air.  GENERAL: No respiratory distress, lying flat in bed.  EYES: Conjunctivae pale, lids normal, pupils equal round and reactive to light; extraocular muscles intact, no nystagmus.  EARS AND NOSE AND MOUTH AND THROAT: Tympanic membranes, no erythema; nasal mucosa, no erythema; throat dry.  NECK: No JVD. No bruits. No lymphadenopathy.  No thyromegaly. No thyroid nodules palpated.  RESPIRATORY: Lungs decreased breath sounds bilaterally; no rhonchi, rales, or wheeze heard.  CARDIOVASCULAR: S1, S2 soft, no gallops, rubs, or murmurs heard; carotid upstroke 2+ bilaterally, no bruises.  EXTREMITIES: Dorsalis pedis pulses, difficult to palpate, with 2+ edema bilateral lower extremity.  ABDOMEN: Soft,  nontender, no organomegaly/splenomegaly; normoactive bowel sounds, no masses felt.  LYMPHATIC: No lymph nodes in the neck.  MUSCULOSKELETAL: With 2+ edema, no clubbing, no cyanosis, on oxygen.  SKIN: Anteriorly, I did not see any lesions, unable to look posteriorly by myself on this patient.  NEUROLOGIC: Cranial nerves difficult to test secondary to altered mental status, the patient moves all extremities to command.  PSYCHIATRIC: Difficult to test secondary to altered mental status.   LABORATORY AND RADIOLOGICAL DATA: Chest x-ray showed left lower lung volumes, patchy right basilar opacity may reflect atelectasis, left PICC line in place; CT scan of the head, no acute abnormality; BNP 62,845, TSH 3.94, troponin borderline at 0.11, INR 1.9, hemoglobin 7.1; white count 8.1, platelet count of 215,000, glucose 118, BUN 133, creatinine 3.13, sodium 133, potassium 3.8, chloride 92, CO2 of 34, calcium 8.6, liver function tests normal range. Urinalysis 3+ leukocyte esterase, 2+ blood.   ASSESSMENT AND PLAN: 1.  Vancomycin resistant enterococci acute cystitis with hematuria with acute encephalopathy. We will give IV Zyvox for right now; follow up urine culture and blood cultures.  2.  Acute renal failure on chronic kidney disease. We will hold Lasix and metolazone and anything that can cause worsening kidney function; we will hold off on IV fluids at this point, I think enough fluid will be given with the antibiotics.  3.  Acute respiratory failure. We will give oxygen supplementation. The patient is therapeutic on Coumadin so pulmonary embolus less likely; we will hold off on further imaging at this point and just watch with oxygen saturations.  4.  Anemia, likely of chronic disease. We will send off iron studies. Continue Procrit on a weekly basis.  5.  Hyperlipidemia, on atorvastatin.  6.  Chronic obstructive pulmonary disease, this is not exacerbation, continue usual inhalers.  7.  Gastroesophageal  reflux disease. Continue Protonix, which he takes at home.  8.  Hypertension. Continue usual medications.  9.  Atrial fibrillation, therapeutic on Coumadin.  10.  Patient is DO NOT RESUSCITATE.   TIME SPENT ON ADMISSION:  Was 55 minutes.    ____________________________ Herschell Dimes. Renae Gloss, MD rjw:nt D: 12/19/2013 18:13:39 ET T: 12/19/2013 18:39:50 ET JOB#: [id number]  cc: Herschell Dimes. Renae Gloss, MD, <Dictator> Drue Flirt, MD Salley Scarlet MD ELECTRONICALLY SIGNED 12/22/2013 14:55

## 2014-05-26 NOTE — Discharge Summary (Signed)
PATIENT NAME:  Evelyn [name], Evelyn [name] MR#:  [id number] DATE OF BIRTH:  1935/04/21  DATE OF ADMISSION:  07/04/2013 DATE OF DISCHARGE:  07/06/2013  The patient left against medical advice on the evening of July 06, 2013.  For a detailed note, please take a look at the history and physical done on admission.   DISCHARGE DIAGNOSES:  1.  Acute on chronic renal failure secondary to dehydration and poor p.o. intake. 2.  Myoclonic jerky movements secondary to renal failure and concomitant use of underlying Lyrica. 3.  History of congestive heart failure, glaucoma, diabetes, chronic obstructive pulmonary disease, hyponatremia, chronic pain and headache.   CONSULTANTS DURING THE HOSPITAL COURSE: Dr. Mosetta PigeonHarmeet Singh from nephrology.   PERTINENT STUDIES DONE DURING THE HOSPITAL COURSE: CT scan of the head done without contrast showed no acute abnormality.   A KUB done on admission showed colonic distention due to an ileus.   An ultrasound of the kidneys showed negative for hydronephrosis or any acute renal abnormality, simple renal cysts, decompressed bladder containing a Foley catheter.   HOSPITAL COURSE: This is a 79 year old female who presented to the hospital with weakness and jerky movements and noted to be in acute on chronic renal failure.  1.  Acute on chronic renal failure. The patient presented to the hospital with a BUN over 100 and a creatinine as high as 5.23. The patient was apparently on high-dose diuretics with about 160 mg of Lasix twice daily along with Aldactone. The most likely cause of her renal failure was dehydration and concomitant use of diuretics. The patient was aggressively hydrated with IV fluids and her kidney function was improving. She was making good urine output. The patient was seen by nephrology and agreed with this management, although the patient's family did not want to keep her in the hospital and took her home against medical advice. The risks were explained to the patient and  the patient's son and they were aware of them.  2.  Myoclonic jerky movements. This is likely secondary to the renal failure and concomitant use of Lyrica. Her Lyrica was held and this was improving.  3.  History of congestive heart failure. We were not clear whether this was systolic or diastolic heart failure. Clinically, the patient had no evidence of CHF. Her diuretics were being held due to acute on chronic renal failure. 4.  Glaucoma. The patient was being maintained on her latanoprost and brimonidine eye drops. 5.  Diabetes. The patient was maintained on some sliding scale insulin and her scheduled oral meds and insulin were being held.  6.  COPD. The patient had no acute exacerbation. She was being maintained on her Advair and Spiriva.  7.  Hyponatremia. This was hypovolemic hyponatremia due to her poor p.o. intake. Her sodium was improving with IV fluid supplementation.  8.  Chronic pain and headache. The patient was being maintained on her Norco as needed.   The patient's clinical condition was actually improving, although the patient's family and the patient did not want to stay in the hospital and therefore left against medical advice. [facility] and [facility] and the son wanted to take her there for further evaluation. The patient's family signed the against medical advice form and she left on the evening of July 06, 2013. ____________________________ Rolly PancakeVivek J. Cherlynn KaiserSainani, MD vjs:sb D: 07/07/2013 08:32:21 ET T: 07/07/2013 09:01:56 ET JOB#: [id number]  cc: Rolly PancakeVivek J. Cherlynn KaiserSainani, MD, <Dictator> Houston SirenVIVEK J Deshia Vanderhoof MD  ELECTRONICALLY SIGNED 07/08/2013 21:56

## 2014-05-26 NOTE — H&P (Signed)
PATIENT NAME:  Evelyn, [name] MR#:  [id number] DATE OF BIRTH:  [birth year]  DATE OF ADMISSION:  01/01/2014  EMERGENCY ROOM PHYSICIAN: Evelyn [name].   CHIEF COMPLAINT: Altered mental status.   HISTORY OF PRESENT ILLNESS: A 79 year old female patient sent from [place]  secondary to altered mental status. The patient was discharged from this hospital on November 20. She had VRE and ESBL UTI, discharged to [facility] with a PICC line and antibiotics are  given. Today she is sent because of altered mental status. The patient was evaluated by the ER physician and work-up showed hypercapnic respiratory failure and acute on chronic renal failure. The patient is able to communicate with me when I examine her. She said she is having pain in the rectum, but no other pain, and she is oriented to place and person. I noticed some jerking movement of the left arm. The patient is unable to give me any other history. The patient right now is on 2 liters. Saturations are 98%.   PAST MEDICAL HISTORY: Significant for coronary artery disease, , hypertension, diabetes, hyperlipidemia, COPD, chronic atrial fibrillation, peripheral vascular disease. Past medical history also significant for history of diabetes and  PAST SURGICAL HISTORY: Unknown.   SOCIAL HISTORY: The patient was a smoker before and now quit, has 30-pack-years history of smoking ( No alcohol. No drugs. Lives at [facility].   FAMILY HISTORY: Mother and father both are deceased. Mother died from complications of heart disease.   REVIEW OF SYSTEMS:  GENERAL: The patient denies any chest pain. No nausea. No vomiting. No abdominal pain. Complains of pain around the rectal area.  GENITOURINARY: No dysuria.  ENDOCRINE: No polyuria or nocturia.  MUSCULOSKELETAL: The patient denies any joint pains. PSYCHIATRICALLY: The patient has no anxiety or insomnia.  EYES: No blurred vision.   MEDICATIONS AT THE NURSING HOME: 1. Atorvastatin  40 mg daily. 2. Cetirizine 10 mg daily. 3. Fluticasone 50 mcg 1 spray daily.  4. Nitroglycerin 0.4 mg sublingual every 5 minutes for chest pain p.r.n.  5. Latanoprost ophthalmic 0.005% one drop in both eyes at bedtime.  6. Percocet 10/325 two tablets every 4 hours as needed for pain.   7. xalatan > 0.2% one drop in both eyes b.i.d. 8. TUMS 500 mg p.o. t.i.d. 9. MiraLax as needed for constipation 17 grams. 10. Ketoconazole 2% cream to the skin.  11. Lactulose 10 gm po b.i.d. for constipation.  12. Lidocaine 5% ointment to affected area for the pain.  13. Milk of magnesia 30 mL once a day as needed for constipation.  14. Protonix 40 mg p.o. b.i.d. 15. Procrit 10,000 units on Wednesdays.  16.  17. Spiriva 18 mcg inhalation daily.  18. Simethicone 80 mg every 6 hours as needed for flatulence. 19. Aldactone 25 mg half tablet daily. 20. Zinc oxide for skin breakdown.   22. Insulin 70/30 four units subcutaneously b.i.d.  23. Furosemide 40 mg daily. 24. 25. Linezolid 600 mg q.12 h. The patient was given linezolid . This was given for ESBL and VRE UTI. ( Stop date for Zyvox also is December 1.   The patient's Coumadin was stopped during the last discharge, and she is also on  Plavix> and that also was stopped. Aspirin was stopped. Potassium chloride was stopped during the last discharge.   ALLERGIES: CODEINE AND REGLAN.   PHYSICAL EXAMINATION:  VITAL SIGNS: Temperature 99.1, heart rate 102, blood pressure 120/56, saturations 100% on 2 liters. GENERAL: The patient is awake .  but appropriately responding. respondingly slowly HEAD: Normocephalic, atraumatic.  EYES: Pupils equal, reacting to light. Extraocular movements are intact.  ENT: The patient has no tympanic membrane congestion. No turbinate hypertrophy. No oropharyngeal erythema.  NECK: Supple. No JVD. No carotid bruit. Normal range of motion.  RESPIRATORY: Clear to auscultation. No wheeze. No rales.  CARDIOVASCULAR: S1, S2 regular.  The patient has no rubs, no murmurs. Carotid upstrokes 2+. Extremities  pulse  difficult to palpate, 2+ edema present.  ABDOMEN: Soft, nontender, nondistended. Bowel sounds present.  LYMPHATICS: No lymph nodes in the neck.  MUSCULOSKELETAL: 2+ edema in the legs, no clubbing. No cyanosis.  SKIN: No lesions.  NEUROLOGIC: The patient is able to follow commands and I do not see any focal neurological deficit. Able to move all extremities.  PSYCHIATRIC: Oriented to time, place and person.     Head CAT scan shows atrophy with patchy periventricular small vessel disease. No intracranial mass or hemorrhage. No acute infarct.  UA is cloudy, leukocyte esterase 2+, WBCs none.   Troponin 0.17.   CBC: WBC 10.1, hemoglobin 8.3, hematocrit 27, platelets 191,000.   Electrolytes: Sodium is 143, potassium 4.6, chloride 99, bicarbonate 34, BUN 131, creatinine 3.29, glucose 62. The patient's creatinine was 2.91, BUN 135 on November 20.   EKG: Shows normal sinus rhythm, 99 beats per minute. No ST-T changes.   ASSESSMENT AND PLAN:  1. A 79 year old female with altered mental status, possibly due to hypercapnic respiratory failure. The patient's ( code status > Do Not Resuscitate, so admitted to medical with telemetry, and started on BiPAP, and repeat a blood gas again in 4 to 5 hours.  2. Acute on chronic renal failure. Start gentle hydration (,hold diuretics. obtain nephrology consult.  3. History of VRE and ESBL urinary tract infection. Continue  linezolid. Stop date is tomorrow. I do not think she needs further doses as she is not febrile and white count is normal. Urinalysis is clean with negative WBC.  4. Chronic obstructive pulmonary disease. The patient is on Spiriva. Continue with Spiriva and nebulizers, will hold off on steroids, at this time, and see how she does.  5. Diabetes mellitus type 2. The patient will be on sliding scale ( with coverage  6. Elevated troponins likely secondary to renal  failure. EKG looks normal and no further troponins will be trended.  7. Constipation. Continue MiraLax as needed.  8. History of glaucoma. Continue eye drops  9. Hyperlipidemia. Continue simvastatin 40 mg daily.    CODE STATUS: DNR.  TIME SPENT: 60 minutes.   ____________________________ Katha HammingSnehalatha Milford Cilento, MD sk:JT D: 01/01/2014 11:58:30 ET T: 01/01/2014 13:39:29 ET JOB#: [id number]  cc: Katha HammingSnehalatha Lennox Leikam, MD, <Dictator> Katha HammingSNEHALATHA Victoriana Aziz MD ELECTRONICALLY SIGNED 01/29/2014 22:58

## 2014-05-26 NOTE — H&P (Signed)
   Subjective/Chief Complaint altered mental status   History of Present Illness Evelyn [name] is a 79 yo woman with PMH of CKD IV, DM, HTN, hyperlipidemia, COPD, PVD, pulmonary HTN (RVSP 56.76mmHg), CM with severe RVE/BAE, a.fib, CAD, chronic foley with MDR UTI, gout, glaucoma. She was hospitalized 11/17-11/20/15 with VRE & ESBL producing E.coli UTI for which she is still being tx'd. She was readmitted 01/01/14 with altered mental status. Work-up significant for hypercarbic respiratory failure requiring bipap and A/CRF. Respiratory status has improved and Evelyn [name] is on 02 by Isabel. However, renal function has not improved and Evelyn [name] is likely ESRD. At present, Evelyn [name] is lying in bed, confused, appears uncomfortable. Family at bedside.   Past History As above   Code Status DNR   Past Med/Surgical Hx:  Multi-drug Resistant Organism (MDRO): Positive culture for ESBL organsim.  Multi-drug Resistant Organism (MDRO): Positive culture for VRE.  Other, see comments: foot ulcer, hyponatrima  Gout:   Atrial Fibrillation:   Anemia:   Cardiac Disease:   Kidney Failure:   COPD:   Hypertension:   ALLERGIES:  Reglan: Unknown  Other- Explain in Comments Line: Unknown  Codeine: Unknown  HOME MEDICATIONS: Medication Instructions Status  glycopyrrolate 0.2 milligram(s) injectable every 4 hours, As needed, secretions Active  LORazepam 0.5 milligram(s) injectable every hour, As needed, for signs of discomfort Active  morphine 2 milligram(s) injectable every 30 minutes, As needed, comfort care pain/dyspnea Active  acetaminophen 650 mg rectal suppository 1 suppository(ies) rectal every 4 hours, As needed, mild pain (1-3/10) or temp. greater than 100.4 Active   Family and Social History:  Family History Non-Contributory   Place of Living Nursing Home   Review of Systems:  ROS Evelyn [name] not able to provide ROS   Medications/Allergies Reviewed Medications/Allergies reviewed   Physical Exam:  GEN critically ill appearing    HEENT hearing intact to voice, Oropharynx clear   NECK trachea midline   RESP no use of accessory muscles  fair air movement, no audible wheeze   CARD irregular rate and rhythm   ABD denies tenderness  soft  hypoactive BS   GU foley catheter in place   EXTR positive edema   SKIN positive ulcers, sacral and heel ulcers   NEURO moves extremities, does not follow commands   PSYCH lethargic    Assessment/Admission Diagnosis ESRD, family declines dialysis. Transition to comfort care.   Plan 1. GIP hospice 2. Hospice home when bed available.   Electronic Signatures: Zela Sobieski, Harriett SineNancy (MD)  (Signed 01-Dec-15 14:58)  Authored: CHIEF COMPLAINT and HISTORY, PAST MEDICAL/SURGIAL HISTORY, ALLERGIES, HOME MEDICATIONS, FAMILY AND SOCIAL HISTORY, REVIEW OF SYSTEMS, PHYSICAL EXAM, ASSESSMENT AND PLAN   Last Updated: 01-Dec-15 14:58 by Brandn Mcgath, Harriett SineNancy (MD)

## 2014-05-26 NOTE — Discharge Summary (Signed)
PATIENT NAME:  Evelyn [name], Evelyn [name] MR#:  [id number] DATE OF BIRTH:  08-Oct-1935  DATE OF ADMISSION:  01/01/2014 DATE OF DISCHARGE:  01/03/2014  PRESENTING COMPLAINT: Altered mental status.   DISPOSITION:  The patient was discharged under general inpatient management for comfort measures and under hospice care.   DISCHARGE DIAGNOSES: 1.  Acute encephalopathy secondary to severe uremia and hypercapnic respiratory failure.  2. Acute on chronic renal failure now progressing to end-stage renal disease, not a good candidate for hemodialysis. Overall poor prognosis, given multiple medical problems.  3.  History of vancomycin-resistant Enterococcus and extended spectrum beta-lactamase, completed treatment with IV linezolid and ertapenem.  4.  Chronic obstructive pulmonary disease.  5.  Type 2 diabetes.  6.  History of glaucoma.  7.  Hyperlipidemia.   CODE STATUS: No code DNR.  CONSULTATIONS:  1.  Ned GraceNancy Phifer, MD,  palliative care.  2. Nephrology, Munsoor Lizabeth LeydenN. Lateef, MD.  BRIEF SUMMARY OF HOSPITAL COURSE: [name] is a 79 year old African American female with multiple medical problems, comes in from nursing home skilled nursing facility with acute encephalopathy altered mental status. She was found to be very hypercapnic and was placed on BiPAP. She remained lethargic and said only a few words. Gradually, she declined and was unable to have a meaningful conversation. She also had significant uremia, which could be contributing to her encephalopathy.  Dr. Cherylann RatelLateef, Dr. Harvie JuniorPhifer spoke with the patient, patient's family, son and given her overall poor prognosis, not able to withstand hemodialysis at this time. Family decided for comfort measures. She was discharged to inpatient hospice general medical admission for inpatient hospice.  The patient remained a no code, DNR.   TIME SPENT: 40 minutes    ____________________________ Jearl KlinefelterSona A. Allena KatzPatel, MD sap:DT D: 01/04/2014 13:19:06 ET T: 01/04/2014 14:53:26  ET JOB#: [id number]  cc: Glenisha Gundry A. Allena KatzPatel, MD, <Dictator> Willow OraSONA A Wolf Boulay MD ELECTRONICALLY SIGNED 01/26/2014 16:59

## 2015-08-21 IMAGING — CT CT HEAD WITHOUT CONTRAST
1 series · 16 of 30 positions shown, 20 images · non-contrast
Comparison: 07/04/2013

CLINICAL DATA: Altered mental status

EXAM:
CT HEAD WITHOUT CONTRAST
TECHNIQUE: Contiguous axial images were obtained from the base of the skull
through the vertex without intravenous contrast.

[Series 2: head wo · axial · 0.39mm/px · z∈[-194,-68]mm · 16 of 32 slices shown, 20 images]
[im 2/32  brain]
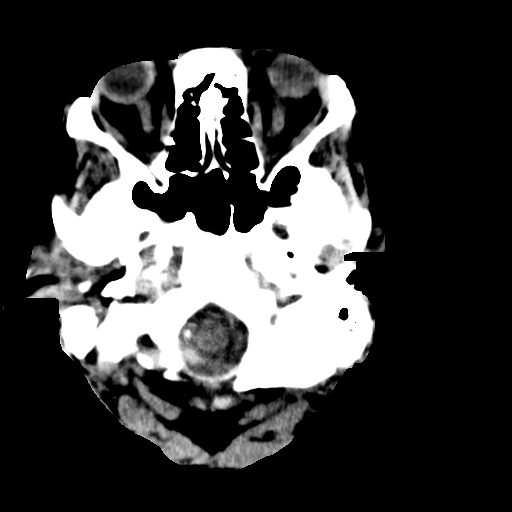
[im 2/32  bone]
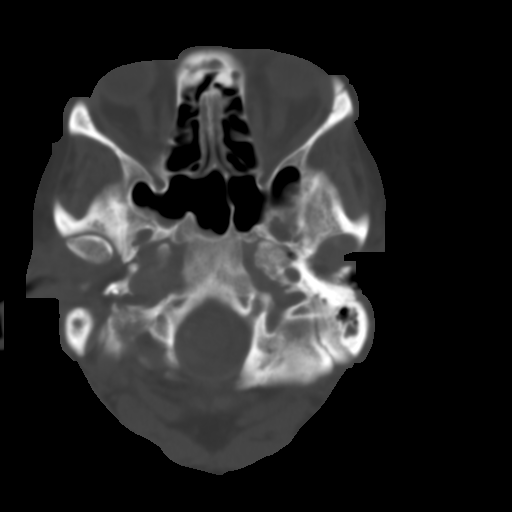
[im 4/32  brain]
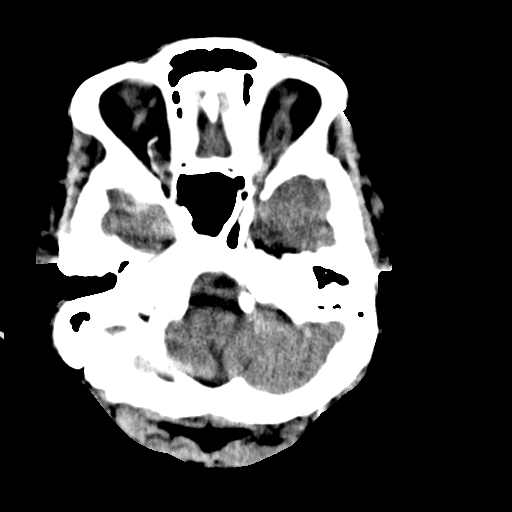
[im 6/32  brain]
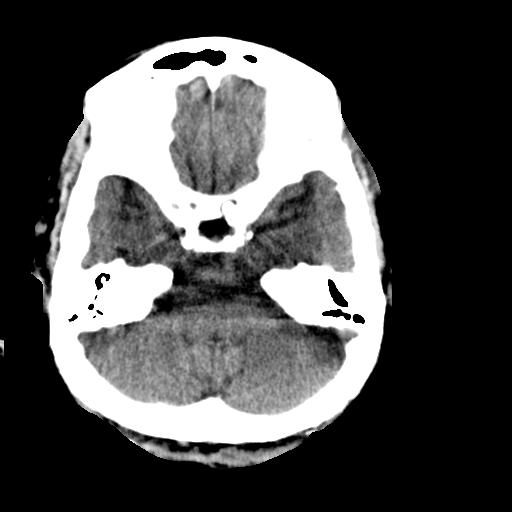
[im 8/32  brain]
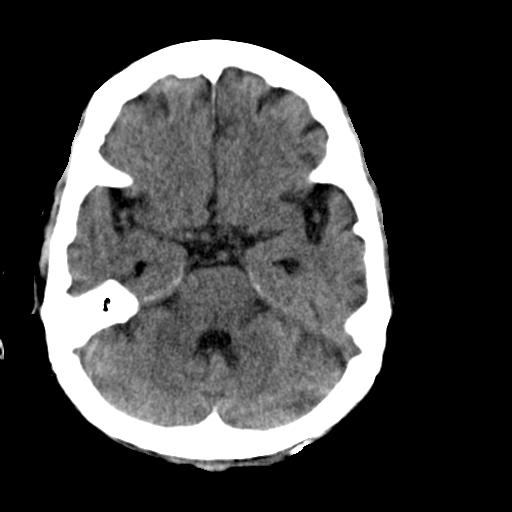
[im 9/32  brain]
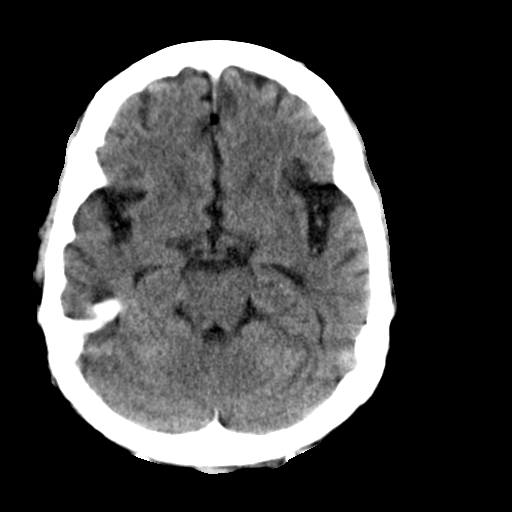
[im 9/32  bone]
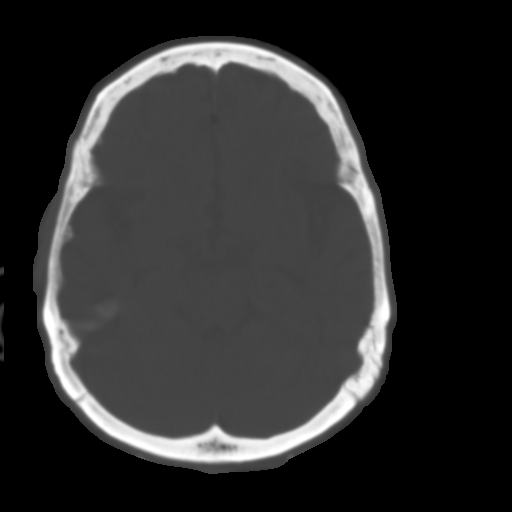
[im 11/32  brain]
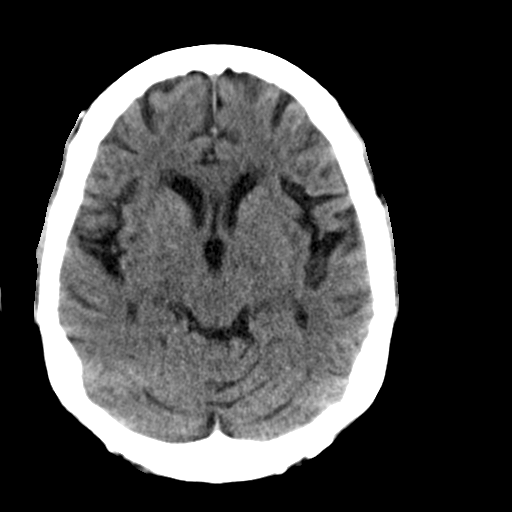
[im 13/32  brain]
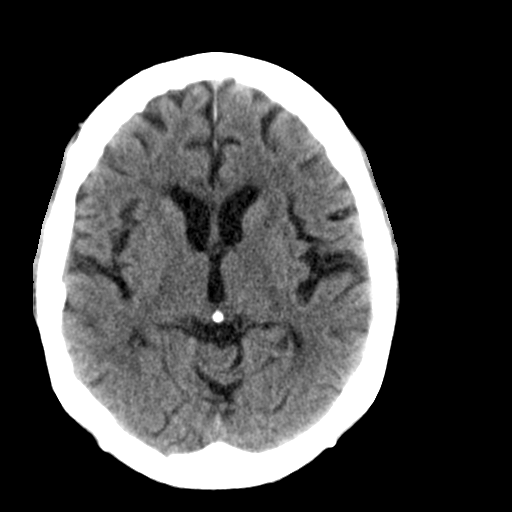
[im 15/32  brain]
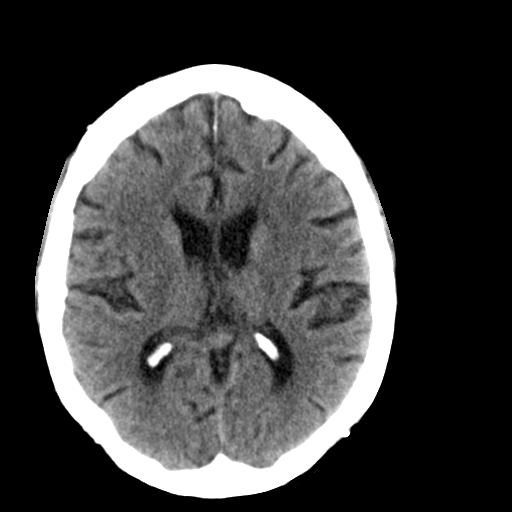
[im 17/32  brain]
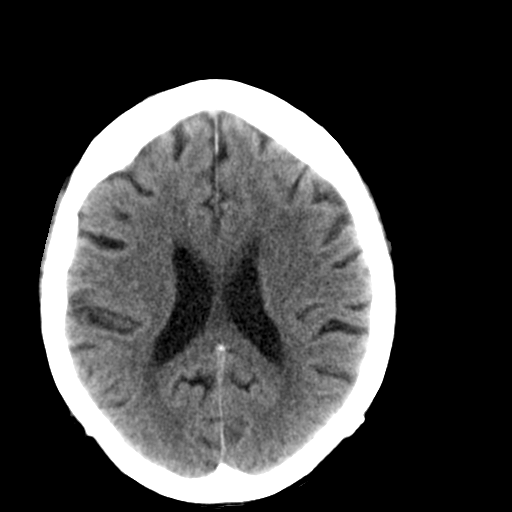
[im 17/32  bone]
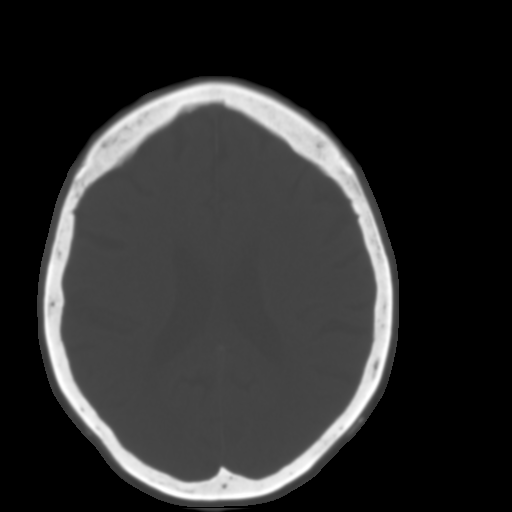
[im 19/32  brain]
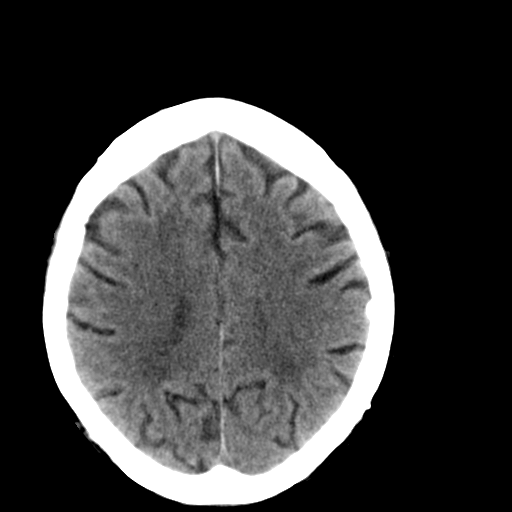
[im 21/32  brain]
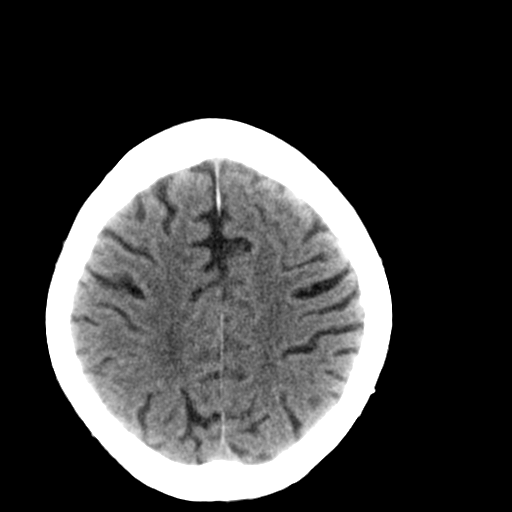
[im 23/32  brain]
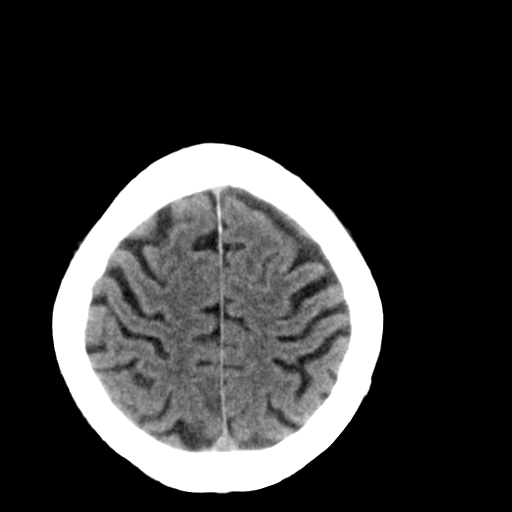
[im 24/32  brain]
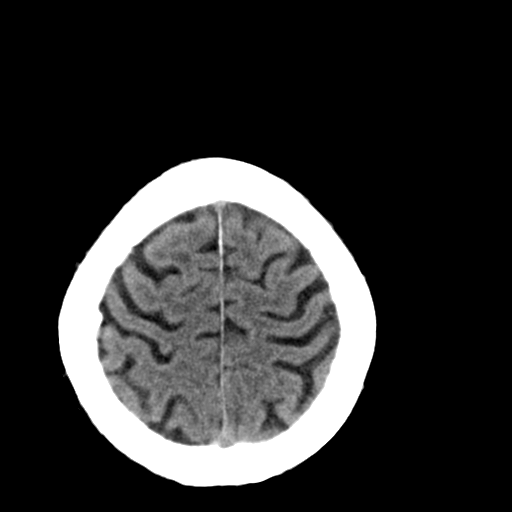
[im 24/32  bone]
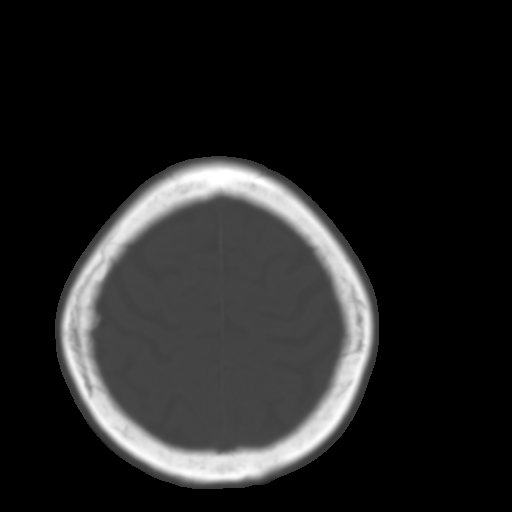
[im 26/32  brain]
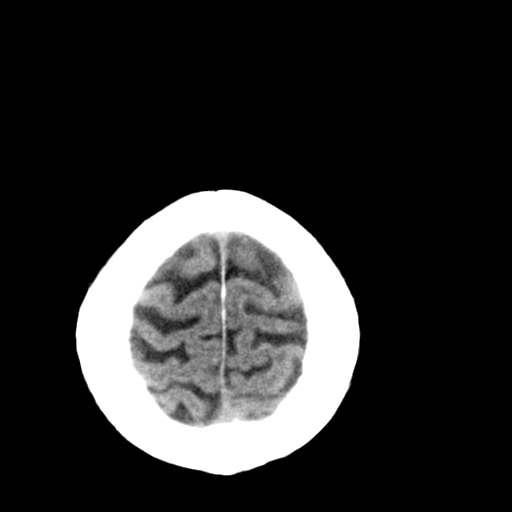
[im 28/32  brain]
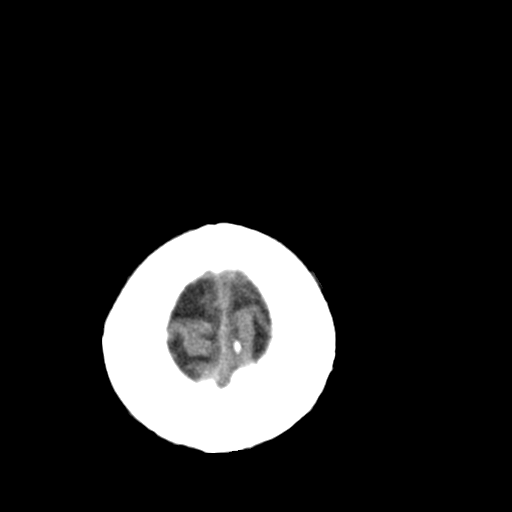
[im 30/32  brain]
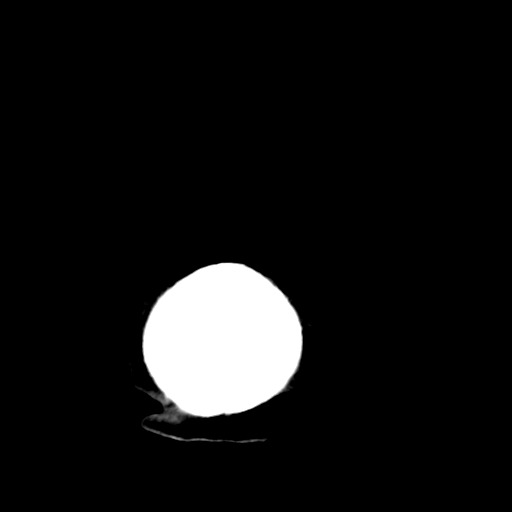

[16 of 30 positions shown; findings below may reference images not displayed]

FINDINGS: No skull fracture is noted. Paranasal sinuses and mastoid air cells
are unremarkable. Atherosclerotic calcifications of carotid siphon.
No intracranial hemorrhage, mass effect or midline shift. Stable
cerebral atrophy. Stable chronic white matter disease. No acute
cortical infarction. No mass lesion is noted on this unenhanced
scan.
IMPRESSION: No acute intracranial abnormality. Stable atrophy and chronic white
matter disease.

## 2015-09-03 IMAGING — CR DG CHEST 1V PORT
1 series · 1 of 1 positions shown · non-contrast
Comparison: 12/19/2013; 10/23/2010

CLINICAL DATA: Nonverbal. Weakness. History of AFib, COPD,
hypertension, former smoker.

EXAM:
PORTABLE CHEST - 1 VIEW

[ap]
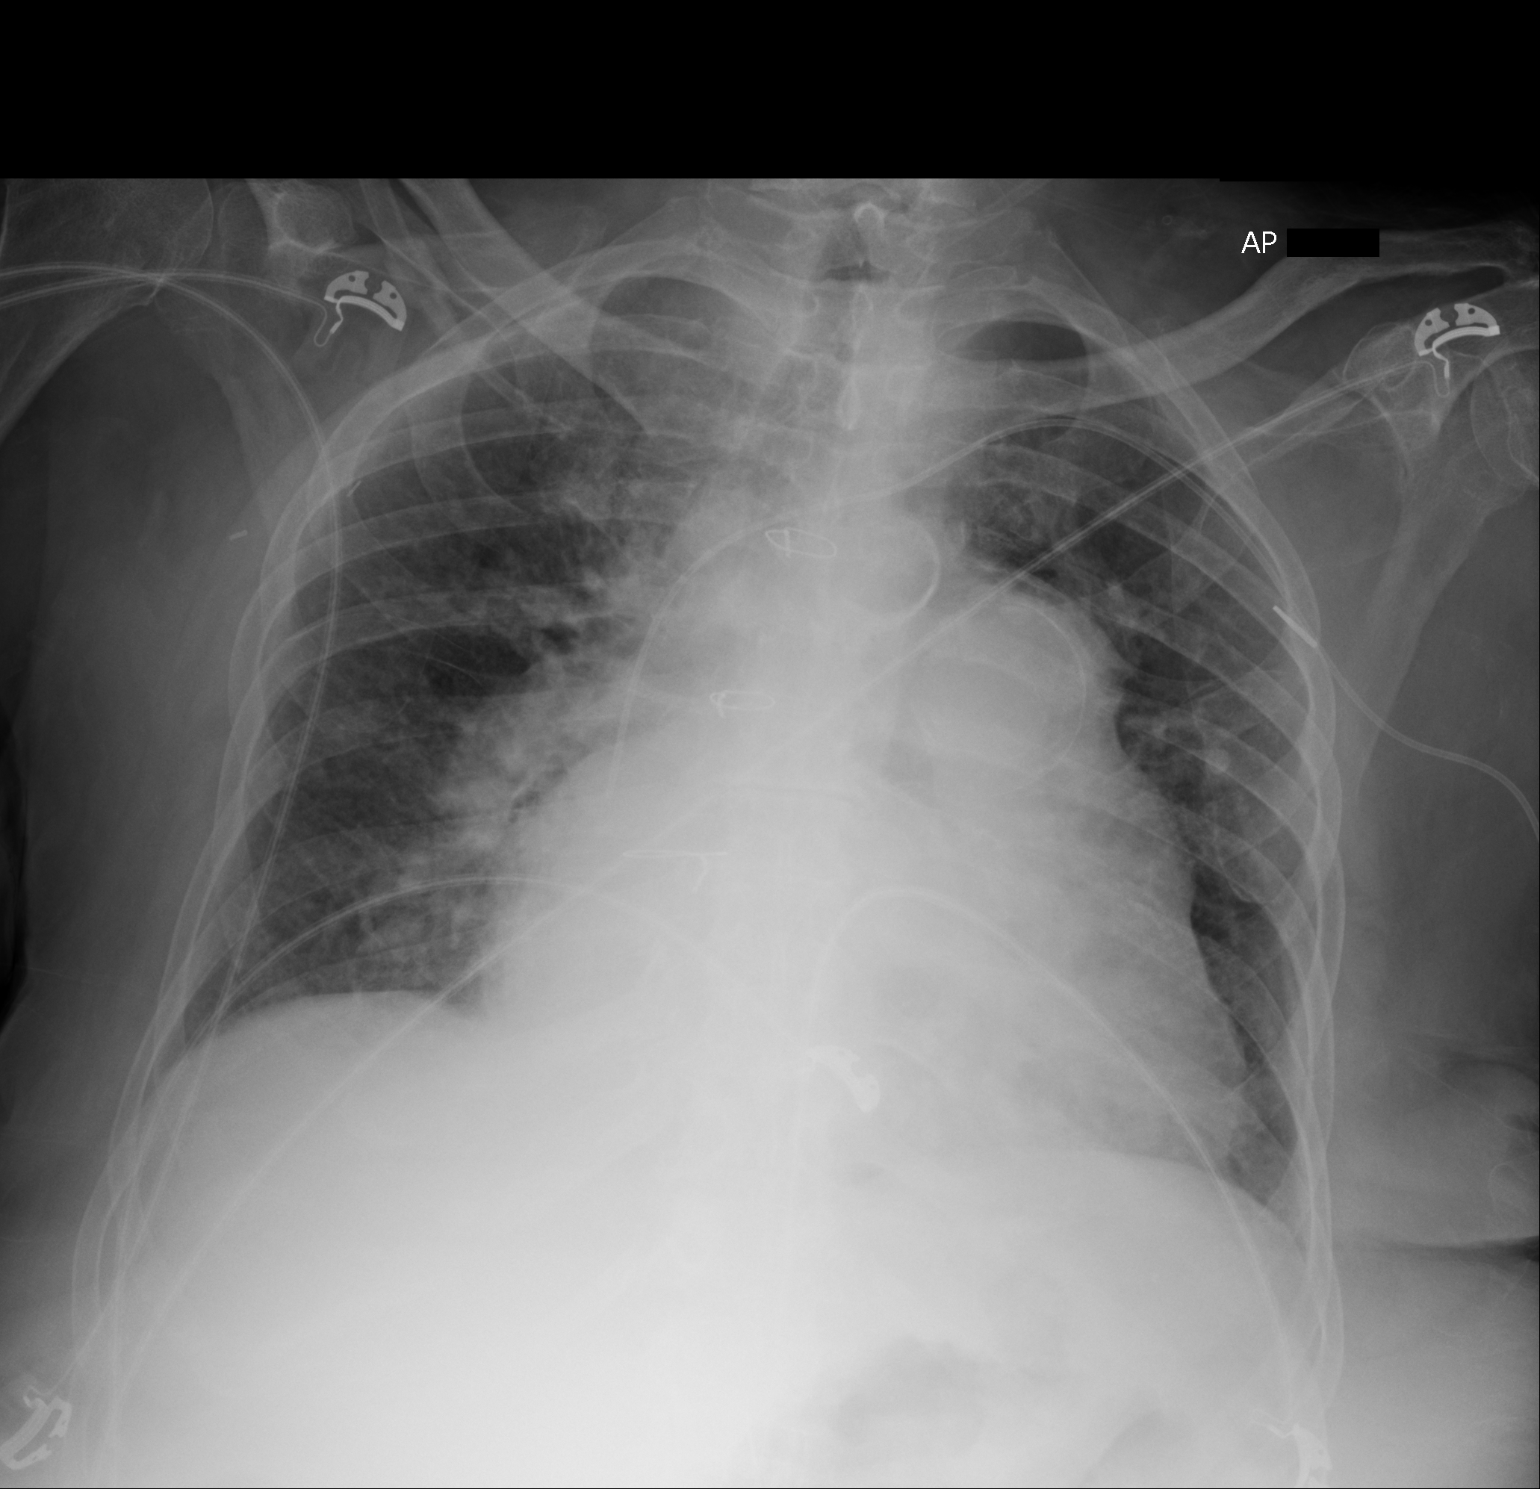

[1 of 1 positions shown; findings below may reference images not displayed]

FINDINGS: Grossly unchanged markedly enlarged cardiac silhouette and
mediastinal contours with atherosclerotic plaque within the thoracic
aorta. There is apparent mural calcification involving the wall of
the main pulmonary artery and prominence of the central pulmonary
vasculature. Pulmonary venous congestion without frank evidence of
edema. No pleural effusion or pneumothorax. Stable positioning of
support apparatus. Surgical clips overlie the bilateral axilla.
IMPRESSION: Similar findings of marked cardiomegaly, pulmonary arterial
hypertension and pulmonary venous congestion without definite
superimposed acute cardiopulmonary disease.

## 2015-09-04 IMAGING — US US RENAL KIDNEY
1 series · 13 of 25 positions shown · non-contrast
Comparison: 07/04/2013 ; correlation abdominal radiograph
07/04/2013

CLINICAL DATA: Chronic kidney disease stage V

EXAM:
RENAL/URINARY TRACT ULTRASOUND COMPLETE

[Series 1: us renal kidney · 0.20mm/px · 13 of 39 slices shown]
[im 1/39]
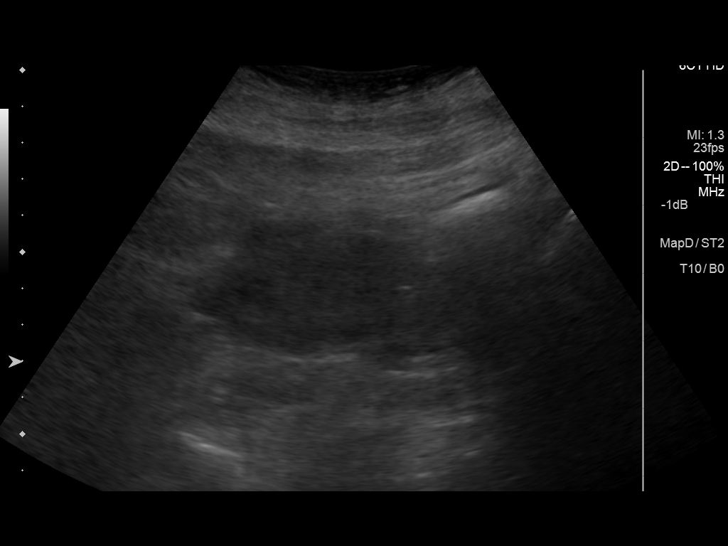
[im 4/39]
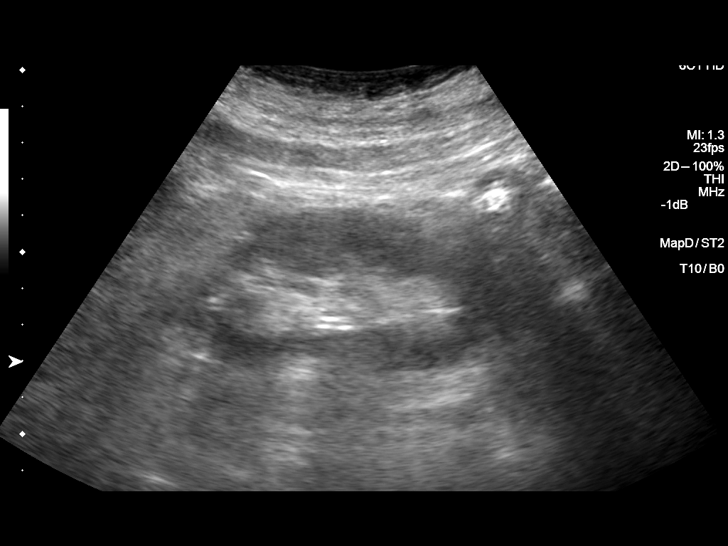
[im 7/39]
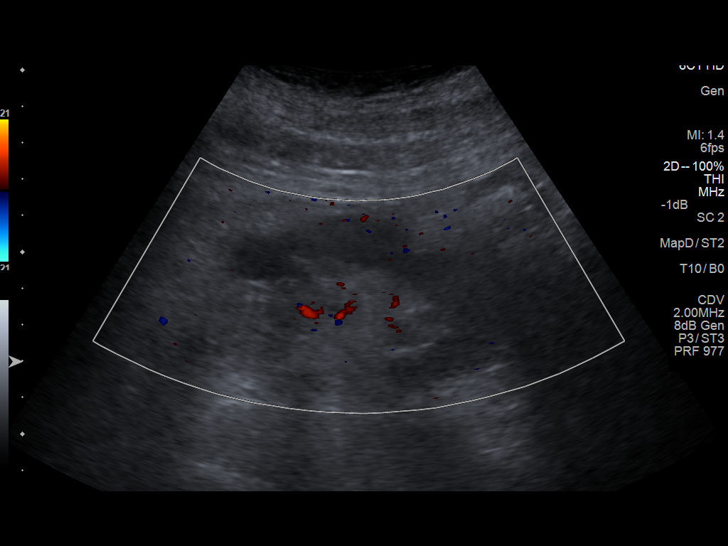
[im 10/39]
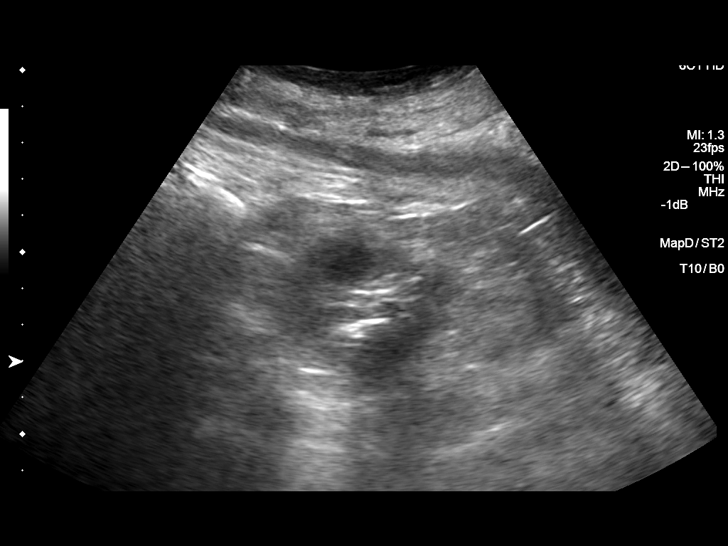
[im 13/39]
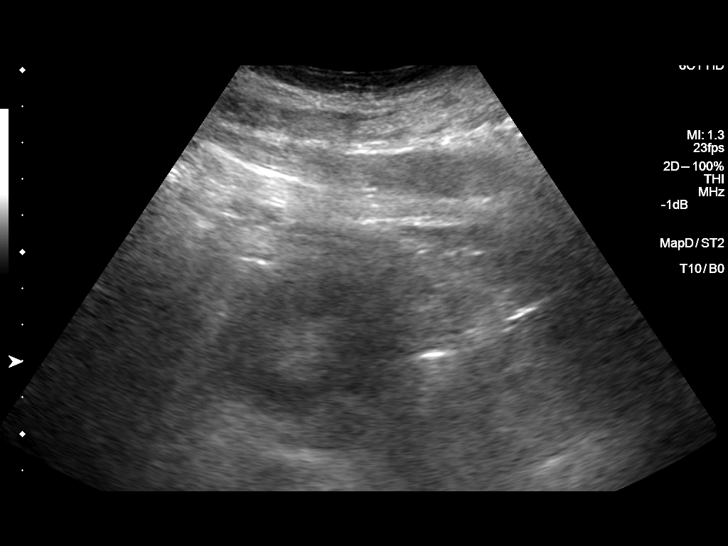
[im 16/39]
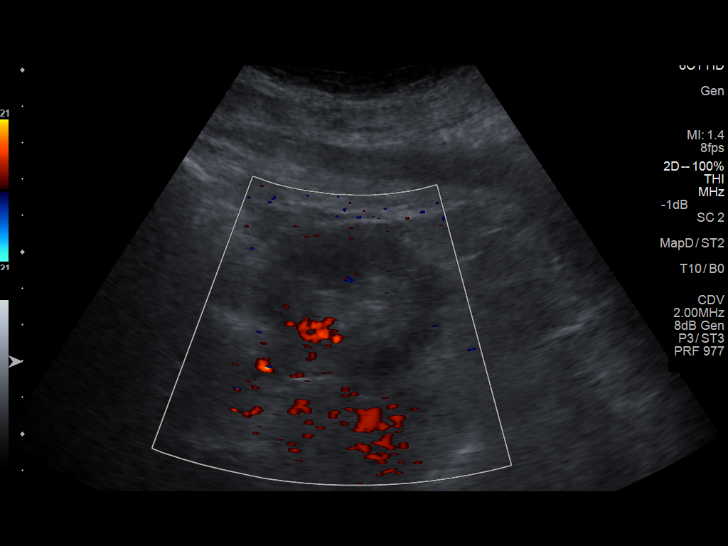
[im 20/39]
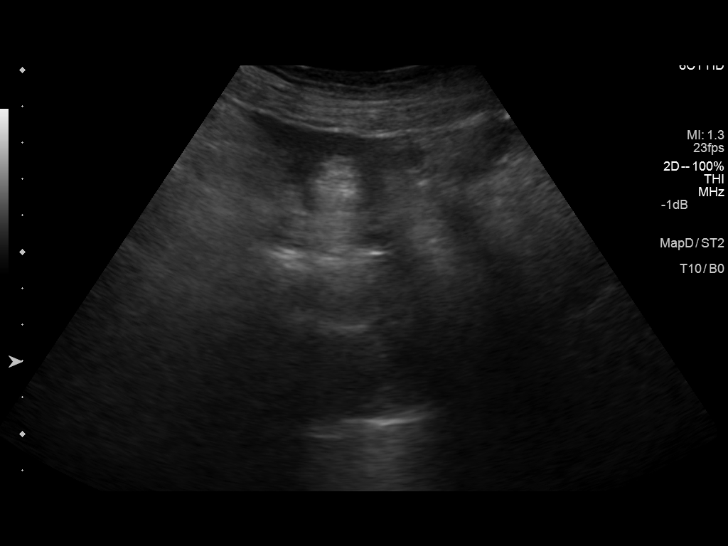
[im 23/39]
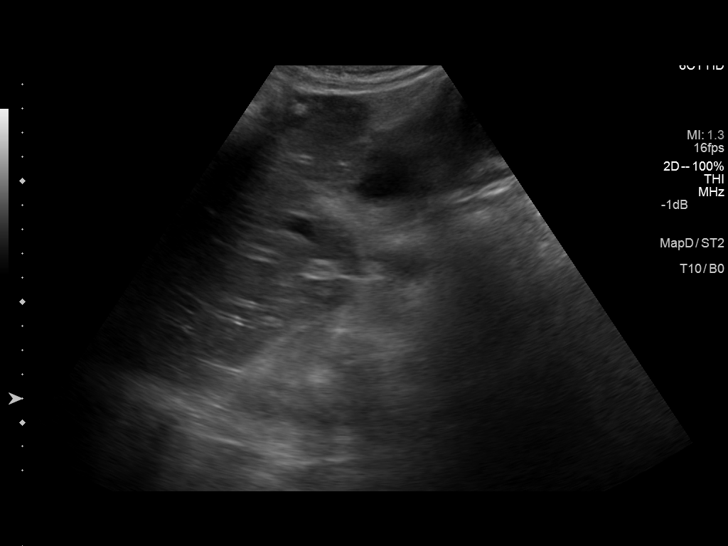
[im 26/39]
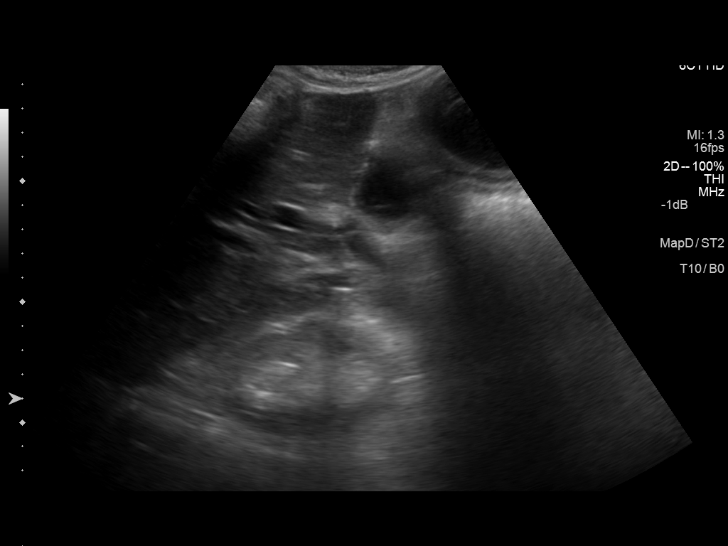
[im 29/39]
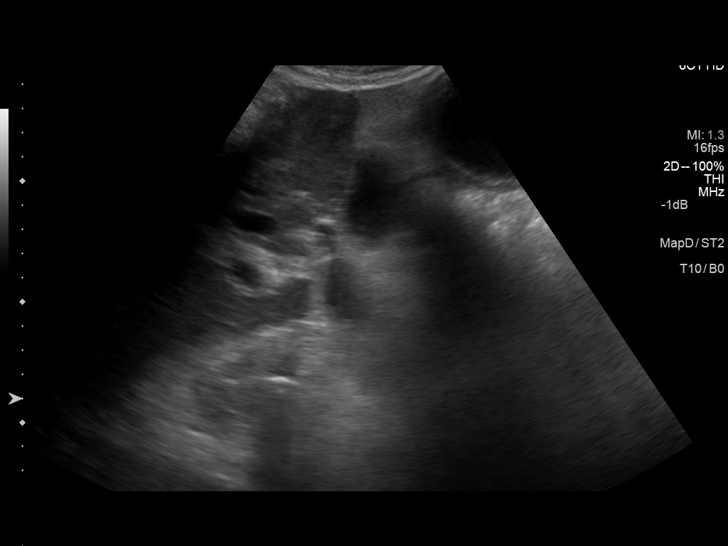
[im 32/39]
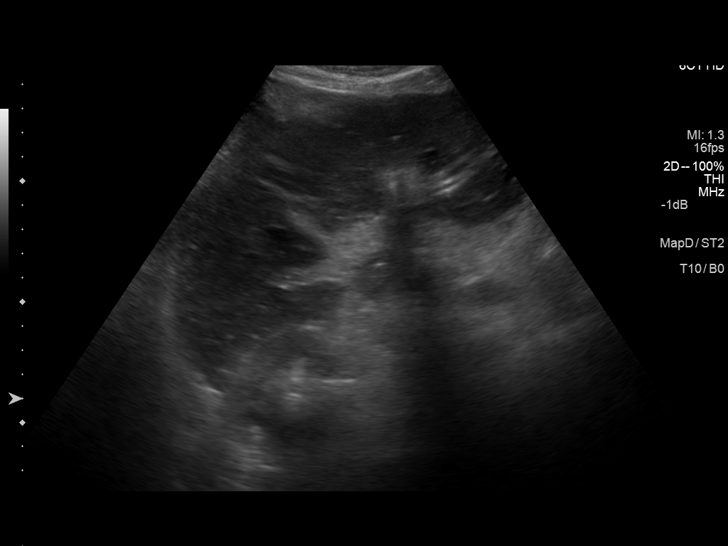
[im 35/39]
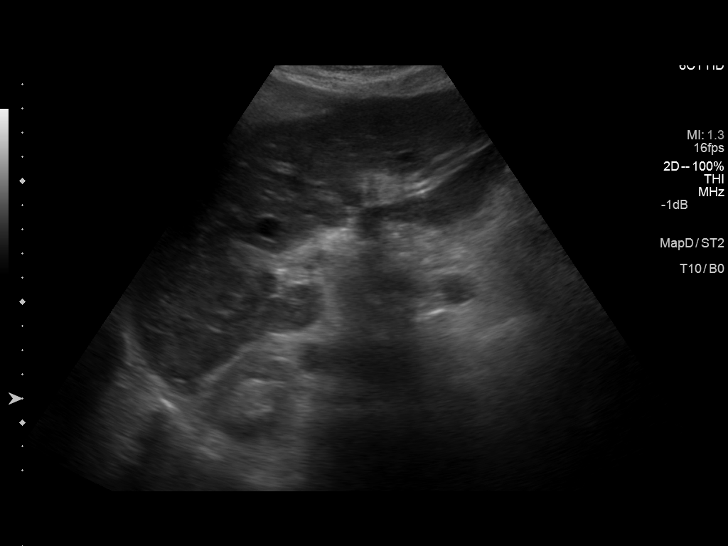
[im 39/39]
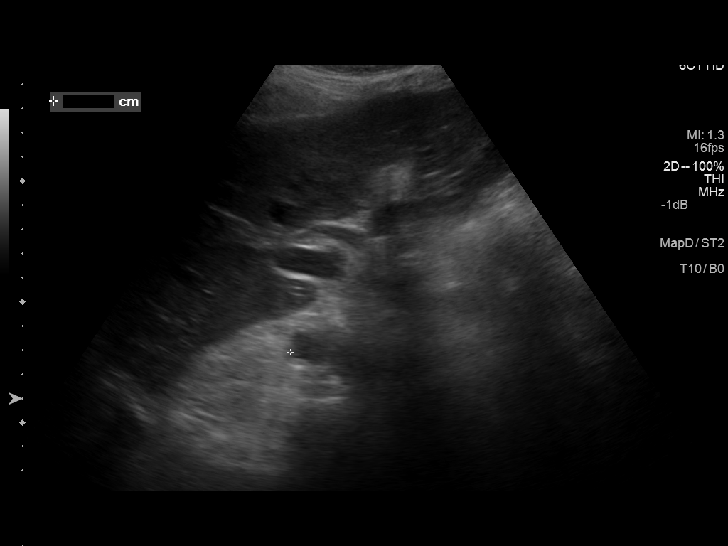

[13 of 25 positions shown; findings below may reference images not displayed]

FINDINGS: Right Kidney:

Length: 8.5 cm. Cortical thinning. Increased cortical echogenicity.
Cyst at mid LEFT kidney 1.6 x 1.1 x 1.8 cm. No definite solid mass,
hydronephrosis or shadowing calcification.

Left Kidney:

Length: 9.5 cm. Minimal cortical thinning. Increased cortical
echogenicity. Small cyst at upper pole 1.5 x 1.4 x 1.6 cm,
containing few low level internal echoes versus scattered artifacts.
No additional mass or hydronephrosis. Linear increased echogenicity
at upper pole roughly corresponds with vascular calcifications
identified on prior abdominal radiograph.

Bladder:

Decompressed by Foley catheter, unable to evaluate.

Incidentally noted questionable nodularity of the liver suggesting
cirrhosis.
IMPRESSION: Renal cortical atrophy with increased cortical echogenicity
bilaterally consistent with medical renal disease.

Small probable BILATERAL renal cysts.

Questionably cirrhotic liver; correlation with patient history
recommended.

Liver could be better assessed by CT or MR imaging.
